# Patient Record
Sex: Male | Born: 1990 | Race: White | Hispanic: No | Marital: Single | Smoking: Never smoker
Health system: Southern US, Community
[De-identification: ages and names within clinical notes are randomized; demographics above are authoritative.]

## PROBLEM LIST (undated history)

## (undated) DIAGNOSIS — Z8669 Personal history of other diseases of the nervous system and sense organs: Secondary | ICD-10-CM

## (undated) HISTORY — PX: TYMPANOSTOMY TUBE PLACEMENT: SHX32

---
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
## 2011-01-25 NOTE — Procedures (Signed)
NAME:  Curtis Holland, Curtis Holland                ACCOUNT NO.:  [id number]   MEDICAL RECORD NO.:  [id number]          PATIENT TYPE:  OUT   LOCATION:  SLEEP CENTER                 FACILITY:  [facility]   PHYSICIAN:  Clinton D. Maple Hudson, MD, FCCP, FACPDATE OF BIRTH:  09-20-91   DATE OF STUDY:  03/07/2008                            NOCTURNAL POLYSOMNOGRAM   REFERRING PHYSICIAN:  Turkey R. Rankins, M.D.   INDICATION FOR STUDY:  Hypersomnia with sleep apnea.   EPWORTH SLEEPINESS SCORE:  1/24.  BMI 37.9.   Weight 235 pounds.  Height 66 inches.  Neck 16.5 inches.  Age 20 years.   MEDICATIONS:  Home medication charted and reviewed, none indicated.   SLEEP ARCHITECTURE:  Total sleep time 238.5 minutes with sleep  efficiency 57.5%.  Stage I was 5.2%, stage II 59.7%, stage III 23.1%,  REM 11.9% of total sleep time.  Sleep latency 22 minutes, REM latency 61  minutes.  Awake after sleep onset 119 minutes.  Arousal index 3.8.  No  bedtime medication was taken.  He was asleep for over an hour starting  at about 2345.   RESPIRATORY DATA:  Apnea-hypopnea index (AHI) 0.5 per hour, which is  normal.  A total of 2 events were scored, both hypopneas.   OXYGEN DATA:  Mild to moderate snoring with oxygen desaturation to a  nadir of 80%.  Mean oxygen saturation through the study was 96.9% on  room air.   CARDIAC DATA:  Sinus rhythm with mild sinus arrhythmia.   MOVEMENT-PARASOMNIA:  No significant movement disturbance.  No bathroom  trips.   IMPRESSIONS-RECOMMENDATIONS:  1. Sleep architecture is significant for sustained nonspecific      intervals of wakefulness during the night.  Mother had reported      home activity suggestive of sleepwalking and sleep-talking but none      was observed on this study night.  2. Occasional respiratory events affecting sleep, apnea-hypopnea index      0.5 per hour (normal range is 0-5 per hour).  Mild to moderate      snoring with oxygen desaturation to a transient nadir of 80%.   A      total of 28 minutes was recorded with oxygen saturation less than      88%.  Consider verifying this with overnight home oximetry for      possible treatment with supplemental oxygen and evaluation for      underlying cardiopulmonary disease if verified.      Clinton D. Maple Hudson, MD, [facility], FACP  Diplomate, Biomedical engineer of Sleep Medicine  Electronically Signed     CDY/MEDQ  D:  03/15/2008 12:27:57  T:  03/15/2008 12:43:31  Job:  [id number]

## 2012-08-16 ENCOUNTER — Emergency Department (HOSPITAL_COMMUNITY)
Admission: EM | Admit: 2012-08-16 | Discharge: 2012-08-16 | Disposition: A | Discharge status: Home / Self Care | Payer: Self-pay | Attending: Emergency Medicine | Admitting: Emergency Medicine

## 2012-08-16 ENCOUNTER — Encounter (HOSPITAL_COMMUNITY): Payer: Self-pay | Admitting: *Deleted

## 2012-08-16 ENCOUNTER — Emergency Department (HOSPITAL_COMMUNITY): Payer: Self-pay

## 2012-08-16 DIAGNOSIS — Y9389 Activity, other specified: Secondary | ICD-10-CM | POA: Insufficient documentation

## 2012-08-16 DIAGNOSIS — F17200 Nicotine dependence, unspecified, uncomplicated: Secondary | ICD-10-CM | POA: Insufficient documentation

## 2012-08-16 DIAGNOSIS — Y929 Unspecified place or not applicable: Secondary | ICD-10-CM | POA: Insufficient documentation

## 2012-08-16 DIAGNOSIS — T185XXA Foreign body in anus and rectum, initial encounter: Secondary | ICD-10-CM | POA: Insufficient documentation

## 2012-08-16 DIAGNOSIS — IMO0002 Reserved for concepts with insufficient information to code with codable children: Secondary | ICD-10-CM | POA: Insufficient documentation

## 2012-08-16 NOTE — ED Notes (Signed)
Pt states dildo is stuck in his rectum, pt states around 02:00 this morning. Pt denies bleeding.

## 2012-08-16 NOTE — ED Notes (Signed)
Pt reports that he has a dildo stuck in rectum since 0200. Denies pain or blood.

## 2012-08-16 NOTE — Consult Note (Addendum)
  I was asked by Dr. Eber Hong in the [facility] emergency department to consult on this patient.  Over-weight 21 year old with rectal foreign body inserted transanally which could not be extracted by the EDP.  The foreign body has been in place since 2:00 AM.  X-ray confirms FB in rectum, translucent.  On Examination the FB can be palpated approximately 8-10 cm from the anal verge by the examiners fingers.  Will attempt to remove the FB in the ED transanally.  If this cannot be done will need to go to the OR for possible laparotomy/laparoscopy or transanal removal under anesthesia.  Marta Lamas. Gae Bon, MD, FACS [phone] [facility]

## 2012-08-16 NOTE — Procedures (Signed)
9 inch long plastic, off-white foreign body removed transanally using digitally assisted angled-forceps techniques.. There was minimal pain with the procedure.  There was some bloody mucoid discharge after removal, but no increased abdominal pain or rectal pain.  The foreign body was removed completely intact.  Marta Lamas. Gae Bon, MD, FACS [phone] [facility] Surgery

## 2012-08-16 NOTE — ED Provider Notes (Addendum)
History     CSN: [id number]  Arrival date & time 08/16/12  0320   First MD Initiated Contact with Patient 08/16/12 [phone]      Chief Complaint  Patient presents with  . Foreign Body in Rectum    (Consider location/radiation/quality/duration/timing/severity/associated sxs/prior treatment) HPI Comments: Pt states that he was using a "dildo" earlier in the evening and states that he accidentally inserted the entire FB into his rectum.  It occurred at 2 PM, he tried to get it out with his own fingers but couldn't.  He has no abd or rectal pain - no f/c/n/v and no other c/o.  Sx are persistent, nothing makes better or worse.  Has no c/o sob / cp.  Patient is a 21 y.o. male presenting with foreign body in rectum. The history is provided by the patient and a parent.  Foreign Body in Rectum    History reviewed. No pertinent past medical history.  History reviewed. No pertinent past surgical history.  History reviewed. No pertinent family history.  History  Substance Use Topics  . Smoking status: Current Every Day Smoker  . Smokeless tobacco: Not on file  . Alcohol Use: No      Review of Systems  All other systems reviewed and are negative.    Allergies  Review of patient's allergies indicates no known allergies.  Home Medications   Current Outpatient Rx  Name  Route  Sig  Dispense  Refill  . ACETAMINOPHEN 325 MG PO TABS   Oral   Take 650 mg by mouth every 6 (six) hours as needed. headache           BP 142/95  Pulse 110  Temp 98.8 F (37.1 C) (Oral)  Resp 16  SpO2 99%  Physical Exam  Nursing note and vitals reviewed. Constitutional: He appears well-developed and well-nourished. No distress.  HENT:  Head: Normocephalic and atraumatic.  Mouth/Throat: Oropharynx is clear and moist. No oropharyngeal exudate.  Eyes: Conjunctivae normal and EOM are normal. Pupils are equal, round, and reactive to light. Right eye exhibits no discharge. Left eye exhibits no  discharge. No scleral icterus.  Neck: Normal range of motion. Neck supple. No JVD present. No thyromegaly present.  Cardiovascular: Normal rate, regular rhythm, normal heart sounds and intact distal pulses.  Exam reveals no gallop and no friction rub.   No murmur heard. Pulmonary/Chest: Effort normal and breath sounds normal. No respiratory distress. He has no wheezes. He has no rales.  Abdominal: Soft. Bowel sounds are normal. He exhibits no distension and no mass. There is no tenderness.  Genitourinary:       Normal appearing rectum on digital exam there are no hemorrhoids, fissures or obvoius palpable FB's.  No bleeding.  Musculoskeletal: Normal range of motion. He exhibits no edema and no tenderness.  Lymphadenopathy:    He has no cervical adenopathy.  Neurological: He is alert. Coordination normal.  Skin: Skin is warm and dry. No rash noted. No erythema.  Psychiatric: He has a normal mood and affect. His behavior is normal.    ED Course  Procedures (including critical care time)  Labs Reviewed - No data to display Dg Abd 1 View  08/16/2012  *RADIOLOGY REPORT*  Clinical Data: Foreign body in the rectum.  ABDOMEN - 1 VIEW  Comparison: None.  Findings: There is a 20 cm linear foreign body identified overlying the distal sigmoid colon and proximal rectum.  No definite free intra-abdominal air is identified, though evaluation for free air  is limited on a single supine view.  The visualized bowel gas pattern is grossly unremarkable.  No acute osseous abnormalities are seen.  IMPRESSION: 20 cm linear foreign body overlying the distal sigmoid colon and proximal rectum.  No free intra-abdominal air seen.   Original Report Authenticated By: Tonia Ghent, M.D.      1. Foreign body of rectum       MDM  abd is soft and non tender, i have ordered a xray showing FB.  I have also attempted to see if FB is seen on anoscopy - I have not been able to reach it - I have d/w Dr. Lindie Spruce with Gen Surg  who will see pt.  Procedure Note:  Anoscopy  Risks benefits alternatives of the procedure given to the patient Verbal Consent obtained Patient placed in the lateral decubitus position Anoscopy performed Findings  No FB seen, no hemorrhoids, loose brown stool present. Patient tolerated procedure without any complaints   Foreign body has been removed by general surgeon , pt has been reexamined and has no ttp in the abd, safe for d/c at this time.      Vida Roller, MD 08/16/12 [id number]  Vida Roller, MD 08/16/12 [phone]

## 2013-10-03 ENCOUNTER — Emergency Department (HOSPITAL_COMMUNITY)
Admission: EM | Admit: 2013-10-03 | Discharge: 2013-10-03 | Disposition: A | Discharge status: Home / Self Care | Payer: Self-pay | Attending: Emergency Medicine | Admitting: Emergency Medicine

## 2013-10-03 ENCOUNTER — Emergency Department (HOSPITAL_COMMUNITY): Payer: Self-pay

## 2013-10-03 ENCOUNTER — Encounter (HOSPITAL_COMMUNITY): Payer: Self-pay | Admitting: Emergency Medicine

## 2013-10-03 DIAGNOSIS — S62319A Displaced fracture of base of unspecified metacarpal bone, initial encounter for closed fracture: Secondary | ICD-10-CM | POA: Insufficient documentation

## 2013-10-03 DIAGNOSIS — Y929 Unspecified place or not applicable: Secondary | ICD-10-CM | POA: Insufficient documentation

## 2013-10-03 DIAGNOSIS — S62344A Nondisplaced fracture of base of fourth metacarpal bone, right hand, initial encounter for closed fracture: Secondary | ICD-10-CM

## 2013-10-03 DIAGNOSIS — F17200 Nicotine dependence, unspecified, uncomplicated: Secondary | ICD-10-CM | POA: Insufficient documentation

## 2013-10-03 DIAGNOSIS — Y9389 Activity, other specified: Secondary | ICD-10-CM | POA: Insufficient documentation

## 2013-10-03 DIAGNOSIS — W2209XA Striking against other stationary object, initial encounter: Secondary | ICD-10-CM | POA: Insufficient documentation

## 2013-10-03 MED ORDER — HYDROCODONE-ACETAMINOPHEN 5-325 MG PO TABS: 1.0000 | ORAL_TABLET | ORAL | Status: DC | PRN

## 2013-10-03 NOTE — Discharge Instructions (Signed)
Take Vicodin for severe pain. No driving or operating heavy machinery will take this drug as it may cause drowsiness. Followup with orthopedics. Keep your hand elevated.  Cast or Splint Care Casts and splints support injured limbs and keep bones from moving while they heal. It is important to care for your cast or splint at home.  HOME CARE INSTRUCTIONS  Keep the cast or splint uncovered during the drying period. It can take 24 to 48 hours to dry if it is made of plaster. A fiberglass cast will dry in less than 1 hour.  Do not rest the cast on anything harder than a pillow for the first 24 hours.  Do not put weight on your injured limb or apply pressure to the cast until your health care provider gives you permission.  Keep the cast or splint dry. Wet casts or splints can lose their shape and may not support the limb as well. A wet cast that has lost its shape can also create harmful pressure on your skin when it dries. Also, wet skin can become infected.  Cover the cast or splint with a plastic bag when bathing or when out in the rain or snow. If the cast is on the trunk of the body, take sponge baths until the cast is removed.  If your cast does become wet, dry it with a towel or a blow dryer on the cool setting only.  Keep your cast or splint clean. Soiled casts may be wiped with a moistened cloth.  Do not place any hard or soft foreign objects under your cast or splint, such as cotton, toilet paper, lotion, or powder.  Do not try to scratch the skin under the cast with any object. The object could get stuck inside the cast. Also, scratching could lead to an infection. If itching is a problem, use a blow dryer on a cool setting to relieve discomfort.  Do not trim or cut your cast or remove padding from inside of it.  Exercise all joints next to the injury that are not immobilized by the cast or splint. For example, if you have a long leg cast, exercise the hip joint and toes. If you  have an arm cast or splint, exercise the shoulder, elbow, thumb, and fingers.  Elevate your injured arm or leg on 1 or 2 pillows for the first 1 to 3 days to decrease swelling and pain.It is best if you can comfortably elevate your cast so it is higher than your heart. SEEK MEDICAL CARE IF:   Your cast or splint cracks.  Your cast or splint is too tight or too loose.  You have unbearable itching inside the cast.  Your cast becomes wet or develops a soft spot or area.  You have a bad smell coming from inside your cast.  You get an object stuck under your cast.  Your skin around the cast becomes red or raw.  You have new pain or worsening pain after the cast has been applied. SEEK IMMEDIATE MEDICAL CARE IF:   You have fluid leaking through the cast.  You are unable to move your fingers or toes.  You have discolored (blue or white), cool, painful, or very swollen fingers or toes beyond the cast.  You have tingling or numbness around the injured area.  You have severe pain or pressure under the cast.  You have any difficulty with your breathing or have shortness of breath.  You have chest pain. Document Released:  08/26/2000 Document Revised: 06/19/2013 Document Reviewed: 03/07/2013 [facility] Patient Information 2014 [place], [place].  Metacarpal Fractures Fractures of metacarpals are breaks in the bones of the hand. They extend from the knuckles to the wrist. These bones can break in many ways. There are different ways of treating these fractures. HOME CARE  Only exercise as told by your doctor.  Return to activities as told by your doctor.  Go to physical therapy as told by your doctor.  Follow your doctor's advice about driving.  Keep the injured hand raised (elevated) above the level of your heart.  If a plaster, fiberglass, or pre-formed splint was applied:  Wear your splint as told and until you are examined again.  Apply ice on the injury for 15-20 minutes at  a time, 03-04 times a day. Put the ice in a plastic bag. Place a towel between your skin and the bag.  Do not get your splint or cast wet. Protect it during bathing with a plastic bag.  Loosen the elastic bandage around the splint if your fingers start to get numb, tingle, get cold, or turn blue.  If the splint is plaster, do not lean it on hard surfaces or put pressure on it for 24 hours after it is put on.  Do not  try to scratch the skin under the cast.  Check the skin around the cast every day. You may put lotion on red or sore areas.  Move the fingers of your casted hand several times a day.  Only take medicine as told by your doctor.  Follow up as told by your doctor. This is very important in order to avoid permanent injury, disability, or lasting (chronic) pain. GET HELP RIGHT AWAY IF:   You develop a rash.  You have problems breathing.  You have any allergy problems.  You have more than a small spot of blood from beneath your cast or splint.  You have redness, puffiness (swelling), or more pain from beneath your cast or splint.  Yellowish white fluid (pus) comes from beneath your cast or splint.  You develop a temperature by mouth above 102 F (38.9 C), not controlled by medicne.  You have a bad smell coming from under your cast or splint.  You have problems moving any of your fingers. If you do not have a window in your cast for looking at the wound, a fluid or a little bleeding may show up as a stain on the outside of your cast. Tell your doctor about any stains you see. MAKE SURE YOU:   Understand these instructions.  Will watch your condition.  Will get help right away if you are not doing well or get worse. Document Released: 02/15/2008 Document Revised: 11/21/2011 Document Reviewed: 08/04/2009 [facility]ExitCare Patient Information 2014 [place], [place].

## 2013-10-03 NOTE — ED Provider Notes (Signed)
CSN: [id number]     Arrival date & time 10/03/13  1850 History  This chart was scribed for non-physician practitioner working with Joya Gaskins, MD by Ronal Fear, ED scribe. This patient was seen in room TR04C/TR04C and the patient's care was started at 8:11 PM.    Chief Complaint  Patient presents with  . Hand Injury   (Consider location/radiation/quality/duration/timing/severity/associated sxs/prior Treatment) The history is provided by the patient. No language interpreter was used.   HPI Comments: Curtis Holland is a 23 y.o. male who presents to the Emergency Department complaining of a right hand injury with dull aching pain after punching a wall 2x hours ago.  Pt was angry after an argument with his father.  History reviewed. No pertinent past medical history. History reviewed. No pertinent past surgical history. History reviewed. No pertinent family history. History  Substance Use Topics  . Smoking status: Current Every Day Smoker  . Smokeless tobacco: Not on file  . Alcohol Use: No    Review of Systems  Musculoskeletal: Positive for arthralgias and joint swelling.  All other systems reviewed and are negative.    Allergies  Review of patient's allergies indicates no known allergies.  Home Medications   Current Outpatient Rx  Name  Route  Sig  Dispense  Refill  . acetaminophen (TYLENOL) 325 MG tablet   Oral   Take 650 mg by mouth every 6 (six) hours as needed. headache          BP 178/103  Pulse 78  Temp(Src) 98.8 F (37.1 C) (Oral)  Resp 18  SpO2 100% Physical Exam  Nursing note and vitals reviewed. Constitutional: He is oriented to person, place, and time. He appears well-developed and well-nourished. No distress.  HENT:  Head: Normocephalic and atraumatic.  Eyes: Conjunctivae and EOM are normal.  Neck: Normal range of motion. Neck supple.  Cardiovascular: Normal rate, regular rhythm and normal heart sounds.   cap refil <3s ;intact distal pulse   Pulmonary/Chest: Effort normal and breath sounds normal.  Musculoskeletal: Normal range of motion. He exhibits no edema.  Swelling laterally to right hand; tender to palpation at mid/proximal aspect of 4th and 5th metacarpals with mild bruising; full flexion and extension of right hand; full ROM of right wrist, right wrist non-tender.  Neurological: He is alert and oriented to person, place, and time.  Skin: Skin is warm and dry.  Skin intact.  Psychiatric: He has a normal mood and affect. His behavior is normal.    ED Course  Procedures (including critical care time) Labs Review Labs Reviewed - No data to display Imaging Review Dg Hand Complete Right  10/03/2013   CLINICAL DATA:  Punched a wall.  EXAM: RIGHT HAND - COMPLETE 3+ VIEW  COMPARISON:  None available for comparison at time of study interpretation.  FINDINGS: Nondisplaced base of the fourth metacarpal fracture with mild impaction, no definite intra-articular extension. In addition, there is a non corticated fracture fragment inferior to the base of the fifth metacarpus best seen on the oblique view. No dislocation. No destructive bony lesions. Dorsal soft tissue swelling with a gas or radiopaque.  IMPRESSION: Nondisplaced base of fourth metacarpal fracture without dislocation.  Fracture fragment inferior to the base of the fifth metacarpus, without discrete donor site, a carpal injury may have this appearance. If clinically indicated, CT of the wrist could be performed.   Electronically Signed   By: Awilda Metro   On: 10/03/2013 20:07   Full ROM wrist,  wrist non-tender, no swelling.  EKG Interpretation   None       MDM   1. Closed nondisplaced fracture of base of fourth metacarpal bone of right hand    Neurovascularly intact. F/u with ortho. Return precautions given. Patient states understanding of treatment care plan and is agreeable.   I personally performed the services described in this documentation, which was  scribed in my presence. The recorded information has been reviewed and is accurate.     Trevor MaceRobyn M Albert, PA-C 10/03/13 2029

## 2013-10-03 NOTE — ED Notes (Signed)
Pt to xray

## 2013-10-03 NOTE — ED Notes (Signed)
Pt reports punching a wall today and having right hand pain.

## 2013-10-03 NOTE — ED Notes (Signed)
Returned from xray

## 2013-10-03 NOTE — Progress Notes (Signed)
Orthopedic Tech Progress Note Patient Details:  Benny LennertJesse J Duncombe [birth year] [id number]  Ortho Devices Type of Ortho Device: Ace wrap;Volar splint Ortho Device/Splint Location: RUE Ortho Device/Splint Interventions: Ordered;Application   Jennye MoccasinHughes, Ebrima Ranta Craig 10/03/2013, 8:29 PM

## 2013-10-04 NOTE — ED Provider Notes (Signed)
Medical screening examination/treatment/procedure(s) were performed by non-physician practitioner and as supervising physician I was immediately available for consultation/collaboration.  EKG Interpretation   None         Zeph Riebel W [place], MD 10/04/13 1136 

## 2015-09-17 ENCOUNTER — Encounter (HOSPITAL_COMMUNITY): Payer: Self-pay | Admitting: *Deleted

## 2015-09-17 ENCOUNTER — Emergency Department (HOSPITAL_COMMUNITY)
Admission: EM | Admit: 2015-09-17 | Discharge: 2015-09-17 | Disposition: A | Discharge status: Home / Self Care | Payer: Self-pay | Attending: Emergency Medicine | Admitting: Emergency Medicine

## 2015-09-17 DIAGNOSIS — H9193 Unspecified hearing loss, bilateral: Secondary | ICD-10-CM | POA: Insufficient documentation

## 2015-09-17 DIAGNOSIS — H9212 Otorrhea, left ear: Secondary | ICD-10-CM | POA: Insufficient documentation

## 2015-09-17 DIAGNOSIS — R05 Cough: Secondary | ICD-10-CM | POA: Insufficient documentation

## 2015-09-17 DIAGNOSIS — H9203 Otalgia, bilateral: Secondary | ICD-10-CM | POA: Insufficient documentation

## 2015-09-17 HISTORY — DX: Personal history of other diseases of the nervous system and sense organs: Z86.69

## 2015-09-17 MED ORDER — AMOXICILLIN 500 MG PO CAPS: 500.0000 mg | ORAL_CAPSULE | Freq: Three times a day (TID) | ORAL | Status: DC

## 2015-09-17 NOTE — ED Provider Notes (Signed)
CSN: [id number]     Arrival date & time 09/17/15  1205 History  By signing my name below, I, Freida Busman, attest that this documentation has been prepared under the direction and in the presence of non-physician practitioner, Rhea Bleacher PA-C. Electronically Signed: Freida Busman, Scribe. 09/17/2015. 1:26 PM.    Chief Complaint  Patient presents with  . Otalgia   The history is provided by the patient. No language interpreter was used.     HPI Comments:  Curtis Holland is a 25 y.o. male who presents to the Emergency Department complaining of bilateral ear pain x ~ 7 months. He states he experiences the pain daily. He also notes drainage from the left ear and mild hearing loss. Pt notes associated cough and congestion x a few weeks. He denies recent fever, and trauma to the ear. Pt admits to using Q-tips to clean his ears. No alleviating factors noted.   Past Medical History  Diagnosis Date  . History of ear infections    Past Surgical History  Procedure Laterality Date  . Tympanostomy tube placement     No family history on file. Social History  Substance Use Topics  . Smoking status: Never Smoker   . Smokeless tobacco: None  . Alcohol Use: No    Review of Systems  Constitutional: Negative for fever, chills and fatigue.  HENT: Positive for congestion, ear pain and hearing loss. Negative for rhinorrhea, sinus pressure and sore throat.   Eyes: Negative for redness.  Respiratory: Positive for cough. Negative for wheezing.   Gastrointestinal: Negative for nausea, vomiting, abdominal pain and diarrhea.  Genitourinary: Negative for dysuria.  Musculoskeletal: Negative for myalgias and neck stiffness.  Skin: Negative for rash.  Neurological: Negative for headaches.  Hematological: Negative for adenopathy.   Allergies  Review of patient's allergies indicates no known allergies.  Home Medications   Prior to Admission medications   Medication Sig Start Date End Date Taking?  Authorizing Provider  acetaminophen (TYLENOL) 325 MG tablet Take 650 mg by mouth every 6 (six) hours as needed. headache    Historical Provider, MD  HYDROcodone-acetaminophen (NORCO/VICODIN) 5-325 MG per tablet Take 1-2 tablets by mouth every 4 (four) hours as needed. 10/03/13   Robyn M Hess, PA-C   BP 146/113 mmHg  Pulse 107  Temp(Src) 98.4 F (36.9 C) (Oral)  Resp 18  Ht 5\' 8"  (1.727 m)  Wt 277 lb (125.646 kg)  BMI 42.13 kg/m2  SpO2 100%   Physical Exam  Constitutional: He appears well-developed and well-nourished. No distress.  HENT:  Head: Normocephalic and atraumatic.  Right Ear: External ear and ear canal normal. Tympanic membrane is erythematous. Tympanic membrane is not retracted and not bulging. No middle ear effusion. Decreased hearing is noted.  Left Ear: Ear canal normal. There is drainage (Mild, clear). Tympanic membrane is erythematous and bulging. Tympanic membrane is not retracted. A middle ear effusion is present. Decreased hearing is noted.  Nose: Nose normal. No mucosal edema or rhinorrhea.  Mouth/Throat: Uvula is midline, oropharynx is clear and moist and mucous membranes are normal. Mucous membranes are not dry. No trismus in the jaw. No uvula swelling. No oropharyngeal exudate, posterior oropharyngeal edema, posterior oropharyngeal erythema or tonsillar abscesses.  Eyes: Conjunctivae are normal. Right eye exhibits no discharge. Left eye exhibits no discharge.  Neck: Normal range of motion. Neck supple.  Cardiovascular: Normal rate, regular rhythm and normal heart sounds.   Pulmonary/Chest: Effort normal and breath sounds normal. No respiratory distress. He has  no wheezes. He has no rales.  Abdominal: Soft. He exhibits no distension. There is no tenderness.  Neurological: He is alert.  Skin: Skin is warm and dry.  Psychiatric: He has a normal mood and affect.  Nursing note and vitals reviewed.   ED Course  Procedures   DIAGNOSTIC STUDIES:  Oxygen Saturation  is 100% on RA, normal by my interpretation.    COORDINATION OF CARE:  1:23 PM Will discharge with amoxicillin and referral to ENT. Discussed treatment plan with pt at bedside and pt agreed to plan.  Vital signs reviewed and are as follows: Filed Vitals:   09/17/15 1258  BP: 146/113  Pulse: 107  Temp: 98.4 F (36.9 C)  Resp: 18      MDM   Final diagnoses:  Ear pain, bilateral  Otorrhea of left ear   Patient with reported hearing loss, drainage, ear pain for several months. Unclear etiology. Will treat for infection with amoxicillin. Feel that the patient warrants ENT follow-up given prolonged symptoms and hearing loss. No signs of malignant otitis externa, history of immune compromise. No current fevers.  I personally performed the services described in this documentation, which was scribed in my presence. The recorded information has been reviewed and is accurate.    Renne CriglerJoshua Jarae Nemmers, PA-C 09/17/15 1346  Pricilla LovelessScott Goldston, MD 09/18/15 [phone]

## 2015-09-17 NOTE — Discharge Instructions (Signed)
Please read and follow all provided instructions.  Your diagnoses today include:  1. Ear pain, bilateral   2. Otorrhea of left ear    Tests performed today include:  Vital signs. See below for your results today.   Medications prescribed:   Amoxicillin - antibiotic  You have been prescribed an antibiotic medicine: take the entire course of medicine even if you are feeling better. Stopping early can cause the antibiotic not to work.  Take any prescribed medications only as directed.  Home care instructions:  Follow any educational materials contained in this packet.  BE VERY CAREFUL not to take multiple medicines containing Tylenol (also called acetaminophen). Doing so can lead to an overdose which can damage your liver and cause liver failure and possibly death.   Follow-up instructions: Please follow-up with your primary care provider in the next 3 days for further evaluation of your symptoms.   Return instructions:   Please return to the Emergency Department if you experience worsening symptoms.   Please return if you have any other emergent concerns.  Additional Information:  Your vital signs today were: BP 146/113 mmHg   Pulse 107   Temp(Src) 98.4 F (36.9 C) (Oral)   Resp 18   Ht 5\' 8"  (1.727 m)   Wt 125.646 kg   BMI 42.13 kg/m2   SpO2 100% If your blood pressure (BP) was elevated above 135/85 this visit, please have this repeated by your doctor within one month. --------------

## 2015-09-17 NOTE — ED Notes (Signed)
Pt with bil ear pain for several weeks.  States L ear drum burst.  Came today b/c he was finally able to get a ride.

## 2018-05-09 ENCOUNTER — Encounter (HOSPITAL_COMMUNITY): Payer: Self-pay

## 2018-05-09 ENCOUNTER — Emergency Department (HOSPITAL_COMMUNITY)
Admission: EM | Admit: 2018-05-09 | Discharge: 2018-05-09 | Disposition: A | Discharge status: Home / Self Care | Payer: Self-pay | Attending: Emergency Medicine | Admitting: Emergency Medicine

## 2018-05-09 DIAGNOSIS — Z203 Contact with and (suspected) exposure to rabies: Secondary | ICD-10-CM | POA: Insufficient documentation

## 2018-05-09 DIAGNOSIS — F329 Major depressive disorder, single episode, unspecified: Secondary | ICD-10-CM | POA: Insufficient documentation

## 2018-05-09 DIAGNOSIS — Z23 Encounter for immunization: Secondary | ICD-10-CM | POA: Insufficient documentation

## 2018-05-09 DIAGNOSIS — Z2914 Encounter for prophylactic rabies immune globin: Secondary | ICD-10-CM | POA: Insufficient documentation

## 2018-05-09 DIAGNOSIS — Z209 Contact with and (suspected) exposure to unspecified communicable disease: Secondary | ICD-10-CM

## 2018-05-09 MED ORDER — RABIES IMMUNE GLOBULIN 150 UNIT/ML IM INJ
20.0000 [IU]/kg | INJECTION | Freq: Once | INTRAMUSCULAR | Status: AC
  Administered 2018-05-09: 2475 [IU] via INTRAMUSCULAR
  Filled 2018-05-09: qty 16.5

## 2018-05-09 MED ORDER — TETANUS-DIPHTH-ACELL PERTUSSIS 5-2.5-18.5 LF-MCG/0.5 IM SUSP
0.5000 mL | Freq: Once | INTRAMUSCULAR | Status: AC
  Administered 2018-05-09: 0.5 mL via INTRAMUSCULAR
  Filled 2018-05-09: qty 0.5

## 2018-05-09 MED ORDER — RABIES VACCINE, PCEC IM SUSR
1.0000 mL | Freq: Once | INTRAMUSCULAR | Status: AC
  Administered 2018-05-09: 1 mL via INTRAMUSCULAR
  Filled 2018-05-09: qty 1

## 2018-05-09 NOTE — ED Triage Notes (Signed)
Pt reports being scratched by a bat today on his right chest. No break in the skin or redness noted.

## 2018-05-09 NOTE — Discharge Instructions (Signed)
You have had a potential rabies exposure from a bat.  Please follow the instructions on the letter provided with your paperwork today as you will need to follow-up with the [place] [facility] urgent care for 3 additional vaccinations.  It is extremely important that you complete this process to protect herself from rabies.  Please read the information about rabies vaccine provided, return if you have any redness swelling, difficulty breathing, facial swelling, lightheadedness or feel as though you are going to pass out or any other symptoms that may suggest an allergic reaction to vaccine.

## 2018-05-09 NOTE — ED Provider Notes (Signed)
[facility]   CSN: [id number] Arrival date & time: 05/09/18  1623     History   Chief Complaint Chief Complaint  Patient presents with  . Animal Bite    HPI Curtis Holland is a 27 y.o. male.  Curtis Holland is a 27 y.o. Male who is otherwise healthy, presents to the emergency department for evaluation after a bad exposure.  Patient reports this morning he was outside taking close down from a line when a bat flew in and latched onto the right side of his upper chest.  He reports he is unsure if he was scratched or bitten.  Has never had rabies vaccines or immunoglobulin before, no prior exposures.  Has not noted any break in the skin or scratch.  Vision is not having any confusion or mental changes, no myalgias, fevers, muscle spasms, hallucinations or other symptoms of rabies.  The history is provided by the patient.  Animal Bite  Contact animal:  Bat Pain details:    Severity:  No pain   Timing:  Unable to specify   Progression:  Unchanged Incident location:  Outside and home Provoked: unprovoked   Notifications:  None Animal's rabies vaccination status:  Never received Animal in possession: no   Tetanus status:  Out of date Relieved by:  None tried Worsened by:  Nothing Associated symptoms: no fever, no numbness, no rash and no swelling     Past Medical History:  Diagnosis Date  . History of ear infections     Patient Active Problem List   Diagnosis Date Noted  . DEPRESSIVE DISORDER NOT ELSEWHERE CLASSIFIED 01/29/2008  . HYPERSOMNIA, ASSOCIATED WITH SLEEP APNEA 01/29/2008    Past Surgical History:  Procedure Laterality Date  . TYMPANOSTOMY TUBE PLACEMENT          Home Medications    Prior to Admission medications   Medication Sig Start Date End Date Taking? Authorizing Provider  acetaminophen (TYLENOL) 325 MG tablet Take 650 mg by mouth every 6 (six) hours as needed. headache    [provider]  amoxicillin (AMOXIL) 500 MG capsule Take 1 capsule (500 mg total) by mouth 3 (three) times daily. 09/17/15   Renne CriglerGeiple, Joshua, PA-C  HYDROcodone-acetaminophen (NORCO/VICODIN) 5-325 MG per tablet Take 1-2 tablets by mouth every 4 (four) hours as needed. 10/03/13   Hess, Nada Boozerobyn M, PA-C    Family History No family history on file.  Social History Social History   Tobacco Use  . Smoking status: Never Smoker  Substance Use Topics  . Alcohol use: No  . Drug use: Yes    Types: Marijuana     Allergies   Patient has no known allergies.   Review of Systems Review of Systems  Constitutional: Negative for chills and fever.  HENT: Negative.   Eyes: Negative for visual disturbance.  Musculoskeletal: Negative for arthralgias and myalgias.  Skin: Negative for rash.  Neurological: Negative for dizziness, weakness and numbness.  Psychiatric/Behavioral: Negative for confusion and hallucinations.     Physical Exam Updated Vital Signs BP (!) 152/90   Pulse 67   Temp 98.5 F (36.9 C) (Oral)   Resp 16   SpO2 100%   Physical Exam  Constitutional: He is oriented to person, place, and time. He appears well-developed and well-nourished. No distress.  HENT:  Head: Normocephalic and atraumatic.  Eyes: Right eye exhibits no discharge. Left eye exhibits no discharge.  Neck: Neck supple.  Cardiovascular: Normal rate, regular  rhythm, normal heart sounds and intact distal pulses.  Pulmonary/Chest: Effort normal and breath sounds normal. No respiratory distress.  Respirations equal and unlabored, patient able to speak in full sentences, lungs clear to auscultation bilaterally Erythema or break in the skin over the right chest wall where patient reports bad exposure  Musculoskeletal:  Moving all extremities without difficulty  Neurological: He is alert and oriented to person, place, and time. Coordination normal.  Skin: Skin is warm and dry. Capillary refill takes less than 2 seconds. No rash  noted. He is not diaphoretic.  Psychiatric: He has a normal mood and affect. His behavior is normal.  Nursing note and vitals reviewed.    ED Treatments / Results  Labs (all labs ordered are listed, but only abnormal results are displayed) Labs Reviewed - No data to display  EKG None  Radiology No results found.  Procedures Procedures (including critical care time)  Medications Ordered in ED Medications  rabies vaccine (RABAVERT) injection 1 mL (has no administration in time range)  rabies immune globulin (HYPERAB/KEDRAB) injection 2,475 Units (has no administration in time range)  Tdap (BOOSTRIX) injection 0.5 mL (has no administration in time range)     Initial Impression / Assessment and Plan / ED Course  I have reviewed the triage vital signs and the nursing notes.  Pertinent labs & imaging results that were available during my care of the patient were reviewed by me and considered in my medical decision making (see chart for details).  Patient presents for evaluation after potential bat exposure.  Was outside his house today when a bat flew in and attached to his right chest.  On exam there is no break in the skin or redness.  Patient is not exhibiting any other symptoms, stable vitals here in the ED.  Received rabies vaccination or immunoglobulin before.  Tetanus not updated recently.  Given concerning story with exposure will provide vaccination and rabies immunoglobulin and update tetanus.  Patient provided with rabies letter with information for follow-up vaccinations on day 3, 7 and 14.  Patient expresses understanding and is in agreement with this plan he will follow-up at [facility] urgent care for further vaccination.  Return precautions discussed and patient expresses understanding and is in agreement with plan.  Final Clinical Impressions(s) / ED Diagnoses   Final diagnoses:  Exposure to bat without known bite  Need for rabies vaccination    ED Discharge  Orders    None       Legrand Rams 05/09/18 1714    Charlynne Pander, MD 05/12/18 2121

## 2018-05-12 ENCOUNTER — Ambulatory Visit (HOSPITAL_COMMUNITY)
Admission: EM | Admit: 2018-05-12 | Discharge: 2018-05-12 | Disposition: A | Discharge status: Home / Self Care | Payer: Self-pay | Attending: Family Medicine | Admitting: Family Medicine

## 2018-05-12 DIAGNOSIS — Z23 Encounter for immunization: Secondary | ICD-10-CM

## 2018-05-12 DIAGNOSIS — Z203 Contact with and (suspected) exposure to rabies: Secondary | ICD-10-CM

## 2018-05-12 MED ORDER — RABIES VACCINE, PCEC IM SUSR
1.0000 mL | Freq: Once | INTRAMUSCULAR | Status: AC
  Administered 2018-05-12: 1 mL via INTRAMUSCULAR

## 2018-05-12 MED ORDER — RABIES VACCINE, PCEC IM SUSR
INTRAMUSCULAR | Status: AC
  Filled 2018-05-12: qty 1

## 2018-05-12 NOTE — ED Notes (Signed)
Pt here for day 3 rabies shot.  

## 2018-05-19 ENCOUNTER — Ambulatory Visit (HOSPITAL_COMMUNITY)
Admission: EM | Admit: 2018-05-19 | Discharge: 2018-05-19 | Disposition: A | Discharge status: Home / Self Care | Payer: Self-pay | Attending: Internal Medicine | Admitting: Internal Medicine

## 2018-05-19 DIAGNOSIS — Z203 Contact with and (suspected) exposure to rabies: Secondary | ICD-10-CM

## 2018-05-19 DIAGNOSIS — Z23 Encounter for immunization: Secondary | ICD-10-CM

## 2018-05-19 MED ORDER — RABIES VACCINE, PCEC IM SUSR
1.0000 mL | Freq: Once | INTRAMUSCULAR | Status: AC
  Administered 2018-05-19: 1 mL via INTRAMUSCULAR

## 2018-05-19 MED ORDER — RABIES VACCINE, PCEC IM SUSR
INTRAMUSCULAR | Status: AC
  Filled 2018-05-19: qty 1

## 2018-05-19 NOTE — ED Notes (Signed)
Pt presents to have day 7 rabies shots Pt tolerated shot well Given in left deltoid

## 2019-04-03 ENCOUNTER — Emergency Department (HOSPITAL_COMMUNITY): Payer: 59 | Admitting: Anesthesiology

## 2019-04-03 ENCOUNTER — Encounter (HOSPITAL_COMMUNITY): Payer: Self-pay | Admitting: *Deleted

## 2019-04-03 ENCOUNTER — Observation Stay (HOSPITAL_COMMUNITY)
Admission: EM | Admit: 2019-04-03 | Discharge: 2019-04-04 | Disposition: A | Discharge status: Home / Self Care | Payer: 59 | Attending: Surgery | Admitting: Surgery

## 2019-04-03 ENCOUNTER — Emergency Department (HOSPITAL_COMMUNITY): Payer: 59

## 2019-04-03 ENCOUNTER — Encounter (HOSPITAL_COMMUNITY)
Admission: EM | Disposition: A | Discharge status: Home / Self Care | Payer: Self-pay | Source: Home / Self Care | Attending: Emergency Medicine

## 2019-04-03 ENCOUNTER — Other Ambulatory Visit: Payer: Self-pay

## 2019-04-03 DIAGNOSIS — Z1159 Encounter for screening for other viral diseases: Secondary | ICD-10-CM | POA: Diagnosis not present

## 2019-04-03 DIAGNOSIS — Z9049 Acquired absence of other specified parts of digestive tract: Secondary | ICD-10-CM

## 2019-04-03 DIAGNOSIS — K3580 Unspecified acute appendicitis: Principal | ICD-10-CM | POA: Insufficient documentation

## 2019-04-03 DIAGNOSIS — G4730 Sleep apnea, unspecified: Secondary | ICD-10-CM | POA: Insufficient documentation

## 2019-04-03 DIAGNOSIS — G4710 Hypersomnia, unspecified: Secondary | ICD-10-CM | POA: Insufficient documentation

## 2019-04-03 DIAGNOSIS — R109 Unspecified abdominal pain: Secondary | ICD-10-CM | POA: Diagnosis present

## 2019-04-03 HISTORY — PX: LAPAROSCOPIC APPENDECTOMY: SHX408

## 2019-04-03 HISTORY — PX: APPENDECTOMY: SHX54

## 2019-04-03 LAB — COMPREHENSIVE METABOLIC PANEL
ALT: 29 U/L (ref 0–44)
AST: 25 U/L (ref 15–41)
Albumin: 4.6 g/dL (ref 3.5–5.0)
Alkaline Phosphatase: 86 U/L (ref 38–126)
Anion gap: 12 (ref 5–15)
BUN: 6 mg/dL (ref 6–20)
CO2: 23 mmol/L (ref 22–32)
Calcium: 9.8 mg/dL (ref 8.9–10.3)
Chloride: 105 mmol/L (ref 98–111)
Creatinine, Ser: 1.04 mg/dL (ref 0.61–1.24)
GFR calc Af Amer: >60 mL/min (ref >60)
GFR calc non Af Amer: >60 mL/min (ref >60)
Glucose, Bld: 99 mg/dL (ref 70–99)
Potassium: 3.4 mmol/L — ABNORMAL LOW (ref 3.5–5.1)
Sodium: 140 mmol/L (ref 135–145)
Total Bilirubin: 1 mg/dL (ref 0.3–1.2)
Total Protein: 7.7 g/dL (ref 6.5–8.1)

## 2019-04-03 LAB — CBC
HCT: 48 % (ref 39.0–52.0)
Hemoglobin: 15.9 g/dL (ref 13.0–17.0)
MCH: 27.1 pg (ref 26.0–34.0)
MCHC: 33.1 g/dL (ref 30.0–36.0)
MCV: 81.9 fL (ref 80.0–100.0)
Platelets: 276 10*3/uL (ref 150–400)
RBC: 5.86 MIL/uL — ABNORMAL HIGH (ref 4.22–5.81)
RDW: 12.5 % (ref 11.5–15.5)
WBC: 12.4 10*3/uL — ABNORMAL HIGH (ref 4.0–10.5)
nRBC: 0 % (ref 0.0–0.2)

## 2019-04-03 LAB — URINALYSIS, ROUTINE W REFLEX MICROSCOPIC
Bilirubin Urine: NEGATIVE
Glucose, UA: NEGATIVE mg/dL
Hgb urine dipstick: NEGATIVE
Ketones, ur: 5 mg/dL — AB
Leukocytes,Ua: NEGATIVE
Nitrite: NEGATIVE
Protein, ur: NEGATIVE mg/dL
Specific Gravity, Urine: 1.02 (ref 1.005–1.030)
pH: 6 (ref 5.0–8.0)

## 2019-04-03 LAB — SARS CORONAVIRUS 2 BY RT PCR (HOSPITAL ORDER, PERFORMED IN ~~LOC~~ HOSPITAL LAB): SARS Coronavirus 2: NEGATIVE

## 2019-04-03 LAB — LIPASE, BLOOD: Lipase: 34 U/L (ref 11–51)

## 2019-04-03 SURGERY — APPENDECTOMY, LAPAROSCOPIC
Anesthesia: General | Site: Abdomen

## 2019-04-03 MED ORDER — HYDROMORPHONE HCL 1 MG/ML IJ SOLN: 0.5000 mg | INTRAMUSCULAR | Status: DC | PRN

## 2019-04-03 MED ORDER — PHENYLEPHRINE 40 MCG/ML (10ML) SYRINGE FOR IV PUSH (FOR BLOOD PRESSURE SUPPORT)
PREFILLED_SYRINGE | INTRAVENOUS | Status: AC
  Filled 2019-04-03: qty 10

## 2019-04-03 MED ORDER — LIDOCAINE HCL (CARDIAC) PF 100 MG/5ML IV SOSY
PREFILLED_SYRINGE | INTRAVENOUS | Status: DC | PRN
  Administered 2019-04-03: 60 mg via INTRATRACHEAL

## 2019-04-03 MED ORDER — EPHEDRINE 5 MG/ML INJ
INTRAVENOUS | Status: AC
  Filled 2019-04-03: qty 10

## 2019-04-03 MED ORDER — SODIUM CHLORIDE 0.9 % IV SOLN
2.0000 g | Freq: Once | INTRAVENOUS | Status: DC
  Filled 2019-04-03: qty 20

## 2019-04-03 MED ORDER — SODIUM CHLORIDE 0.9 % IR SOLN
Status: DC | PRN
  Administered 2019-04-03: 1000 mL

## 2019-04-03 MED ORDER — IBUPROFEN 200 MG PO TABS: 600.0000 mg | ORAL_TABLET | Freq: Four times a day (QID) | ORAL | Status: DC | PRN

## 2019-04-03 MED ORDER — DIPHENHYDRAMINE HCL 50 MG/ML IJ SOLN: 12.5000 mg | Freq: Four times a day (QID) | INTRAMUSCULAR | Status: DC | PRN

## 2019-04-03 MED ORDER — PROPOFOL 10 MG/ML IV BOLUS
INTRAVENOUS | Status: AC
  Filled 2019-04-03: qty 20

## 2019-04-03 MED ORDER — METRONIDAZOLE IN NACL 5-0.79 MG/ML-% IV SOLN
500.0000 mg | Freq: Once | INTRAVENOUS | Status: AC
  Administered 2019-04-03: 21:00:00 500 mg via INTRAVENOUS
  Filled 2019-04-03: qty 100

## 2019-04-03 MED ORDER — FENTANYL CITRATE (PF) 250 MCG/5ML IJ SOLN
INTRAMUSCULAR | Status: AC
  Filled 2019-04-03: qty 5

## 2019-04-03 MED ORDER — SODIUM CHLORIDE (PF) 0.9 % IJ SOLN
INTRAMUSCULAR | Status: AC
  Filled 2019-04-03: qty 10

## 2019-04-03 MED ORDER — IOHEXOL 300 MG/ML  SOLN
100.0000 mL | Freq: Once | INTRAMUSCULAR | Status: AC | PRN
  Administered 2019-04-03: 100 mL via INTRAVENOUS

## 2019-04-03 MED ORDER — MIDAZOLAM HCL 5 MG/5ML IJ SOLN
INTRAMUSCULAR | Status: DC | PRN
  Administered 2019-04-03: 2 mg via INTRAVENOUS

## 2019-04-03 MED ORDER — SUCCINYLCHOLINE CHLORIDE 20 MG/ML IJ SOLN
INTRAMUSCULAR | Status: DC | PRN
  Administered 2019-04-03: 120 mg via INTRAVENOUS

## 2019-04-03 MED ORDER — BUPIVACAINE-EPINEPHRINE (PF) 0.25% -1:200000 IJ SOLN
INTRAMUSCULAR | Status: AC
  Filled 2019-04-03: qty 30

## 2019-04-03 MED ORDER — ONDANSETRON HCL 4 MG/2ML IJ SOLN
INTRAMUSCULAR | Status: AC
  Filled 2019-04-03: qty 2

## 2019-04-03 MED ORDER — ARTIFICIAL TEARS OPHTHALMIC OINT
TOPICAL_OINTMENT | OPHTHALMIC | Status: AC
  Filled 2019-04-03: qty 3.5

## 2019-04-03 MED ORDER — ACETAMINOPHEN 325 MG PO TABS
650.0000 mg | ORAL_TABLET | Freq: Four times a day (QID) | ORAL | Status: DC
  Administered 2019-04-04 (×3): 650 mg via ORAL
  Filled 2019-04-03 (×3): qty 2

## 2019-04-03 MED ORDER — SODIUM CHLORIDE 0.9% FLUSH
3.0000 mL | Freq: Once | INTRAVENOUS | Status: AC
  Administered 2019-04-03: 3 mL via INTRAVENOUS

## 2019-04-03 MED ORDER — SUCCINYLCHOLINE CHLORIDE 200 MG/10ML IV SOSY
PREFILLED_SYRINGE | INTRAVENOUS | Status: AC
Start: 2019-04-03 — End: ?
  Filled 2019-04-03: qty 20

## 2019-04-03 MED ORDER — ONDANSETRON HCL 4 MG/2ML IJ SOLN: 4.0000 mg | Freq: Four times a day (QID) | INTRAMUSCULAR | Status: DC | PRN

## 2019-04-03 MED ORDER — DEXAMETHASONE SODIUM PHOSPHATE 10 MG/ML IJ SOLN
INTRAMUSCULAR | Status: DC | PRN
  Administered 2019-04-03: 10 mg via INTRAVENOUS

## 2019-04-03 MED ORDER — ROCURONIUM BROMIDE 10 MG/ML (PF) SYRINGE
PREFILLED_SYRINGE | INTRAVENOUS | Status: AC
  Filled 2019-04-03: qty 10

## 2019-04-03 MED ORDER — EPHEDRINE SULFATE 50 MG/ML IJ SOLN
INTRAMUSCULAR | Status: DC | PRN
  Administered 2019-04-03 (×2): 5 mg via INTRAVENOUS
  Administered 2019-04-03: 10 mg via INTRAVENOUS

## 2019-04-03 MED ORDER — CEFAZOLIN SODIUM 1 G IJ SOLR
INTRAMUSCULAR | Status: AC
  Filled 2019-04-03: qty 20

## 2019-04-03 MED ORDER — METRONIDAZOLE IN NACL 5-0.79 MG/ML-% IV SOLN
500.0000 mg | Freq: Once | INTRAVENOUS | Status: DC
  Filled 2019-04-03: qty 100

## 2019-04-03 MED ORDER — PROPOFOL 10 MG/ML IV BOLUS
INTRAVENOUS | Status: DC | PRN
  Administered 2019-04-03: 200 mg via INTRAVENOUS

## 2019-04-03 MED ORDER — ROCURONIUM BROMIDE 100 MG/10ML IV SOLN
INTRAVENOUS | Status: DC | PRN
  Administered 2019-04-03: 50 mg via INTRAVENOUS

## 2019-04-03 MED ORDER — FENTANYL CITRATE (PF) 250 MCG/5ML IJ SOLN
INTRAMUSCULAR | Status: DC | PRN
  Administered 2019-04-03: 100 ug via INTRAVENOUS
  Administered 2019-04-03: 50 ug via INTRAVENOUS

## 2019-04-03 MED ORDER — SUGAMMADEX SODIUM 200 MG/2ML IV SOLN
INTRAVENOUS | Status: DC | PRN
  Administered 2019-04-03: 200 mg via INTRAVENOUS

## 2019-04-03 MED ORDER — DIPHENHYDRAMINE HCL 50 MG/ML IJ SOLN
INTRAMUSCULAR | Status: AC
  Filled 2019-04-03: qty 1

## 2019-04-03 MED ORDER — TRAMADOL HCL 50 MG PO TABS: 50.0000 mg | ORAL_TABLET | Freq: Four times a day (QID) | ORAL | Status: DC | PRN

## 2019-04-03 MED ORDER — LIDOCAINE 2% (20 MG/ML) 5 ML SYRINGE
INTRAMUSCULAR | Status: AC
  Filled 2019-04-03: qty 5

## 2019-04-03 MED ORDER — SIMETHICONE 80 MG PO CHEW: 40.0000 mg | CHEWABLE_TABLET | Freq: Four times a day (QID) | ORAL | Status: DC | PRN

## 2019-04-03 MED ORDER — MIDAZOLAM HCL 2 MG/2ML IJ SOLN
INTRAMUSCULAR | Status: AC
  Filled 2019-04-03: qty 2

## 2019-04-03 MED ORDER — SODIUM CHLORIDE 0.9 % IV SOLN
2.0000 g | Freq: Once | INTRAVENOUS | Status: AC
  Administered 2019-04-03: 21:00:00 2 g via INTRAVENOUS

## 2019-04-03 MED ORDER — DIPHENHYDRAMINE HCL 12.5 MG/5ML PO ELIX: 12.5000 mg | ORAL_SOLUTION | Freq: Four times a day (QID) | ORAL | Status: DC | PRN

## 2019-04-03 MED ORDER — 0.9 % SODIUM CHLORIDE (POUR BTL) OPTIME
TOPICAL | Status: DC | PRN
  Administered 2019-04-03: 1000 mL

## 2019-04-03 MED ORDER — HYDROMORPHONE HCL 1 MG/ML IJ SOLN
0.2500 mg | INTRAMUSCULAR | Status: DC | PRN
  Administered 2019-04-03: 0.5 mg via INTRAVENOUS

## 2019-04-03 MED ORDER — DOCUSATE SODIUM 100 MG PO CAPS
100.0000 mg | ORAL_CAPSULE | Freq: Two times a day (BID) | ORAL | Status: DC
  Administered 2019-04-04 (×2): 100 mg via ORAL
  Filled 2019-04-03 (×2): qty 1

## 2019-04-03 MED ORDER — BUPIVACAINE-EPINEPHRINE 0.25% -1:200000 IJ SOLN
INTRAMUSCULAR | Status: DC | PRN
  Administered 2019-04-03: 30 mL

## 2019-04-03 MED ORDER — SUCCINYLCHOLINE CHLORIDE 200 MG/10ML IV SOSY
PREFILLED_SYRINGE | INTRAVENOUS | Status: AC
  Filled 2019-04-03: qty 10

## 2019-04-03 MED ORDER — ONDANSETRON 4 MG PO TBDP: 4.0000 mg | ORAL_TABLET | Freq: Four times a day (QID) | ORAL | Status: DC | PRN

## 2019-04-03 MED ORDER — HYDROMORPHONE HCL 1 MG/ML IJ SOLN
INTRAMUSCULAR | Status: AC
  Filled 2019-04-03: qty 1

## 2019-04-03 MED ORDER — LIDOCAINE 2% (20 MG/ML) 5 ML SYRINGE
INTRAMUSCULAR | Status: AC
  Filled 2019-04-03: qty 20

## 2019-04-03 MED ORDER — LACTATED RINGERS IV SOLN
INTRAVENOUS | Status: DC | PRN
  Administered 2019-04-03 (×2): via INTRAVENOUS

## 2019-04-03 MED ORDER — HEPARIN SODIUM (PORCINE) 5000 UNIT/ML IJ SOLN
5000.0000 [IU] | Freq: Three times a day (TID) | INTRAMUSCULAR | Status: DC
  Administered 2019-04-04 (×2): 5000 [IU] via SUBCUTANEOUS
  Filled 2019-04-03 (×2): qty 1

## 2019-04-03 MED ORDER — ONDANSETRON HCL 4 MG/2ML IJ SOLN
INTRAMUSCULAR | Status: DC | PRN
  Administered 2019-04-03: 4 mg via INTRAVENOUS

## 2019-04-03 MED ORDER — LACTATED RINGERS IV SOLN
INTRAVENOUS | Status: DC
  Administered 2019-04-04: via INTRAVENOUS

## 2019-04-03 MED ORDER — HYDRALAZINE HCL 20 MG/ML IJ SOLN: 10.0000 mg | INTRAMUSCULAR | Status: DC | PRN

## 2019-04-03 SURGICAL SUPPLY — 48 items
ADH SKN CLS APL DERMABOND .7 (GAUZE/BANDAGES/DRESSINGS) ×1
APL PRP STRL LF DISP 70% ISPRP (MISCELLANEOUS) ×1
APPLIER CLIP 5 13 M/L LIGAMAX5 (MISCELLANEOUS)
APR CLP MED LRG 5 ANG JAW (MISCELLANEOUS)
BAG SPEC RTRVL LRG 6X4 10 (ENDOMECHANICALS) ×1
BLADE CLIPPER SURG (BLADE) IMPLANT
CANISTER SUCT 3000ML PPV (MISCELLANEOUS) ×3 IMPLANT
CHLORAPREP W/TINT 26 (MISCELLANEOUS) ×3 IMPLANT
CLIP APPLIE 5 13 M/L LIGAMAX5 (MISCELLANEOUS) IMPLANT
COVER SURGICAL LIGHT HANDLE (MISCELLANEOUS) ×3 IMPLANT
COVER WAND RF STERILE (DRAPES) ×3 IMPLANT
CUTTER FLEX LINEAR 45M (STAPLE) ×3 IMPLANT
DECANTER SPIKE VIAL GLASS SM (MISCELLANEOUS) ×2 IMPLANT
DERMABOND ADVANCED (GAUZE/BANDAGES/DRESSINGS) ×2
DERMABOND ADVANCED .7 DNX12 (GAUZE/BANDAGES/DRESSINGS) ×1 IMPLANT
ELECT REM PT RETURN 9FT ADLT (ELECTROSURGICAL) ×3
ELECTRODE REM PT RTRN 9FT ADLT (ELECTROSURGICAL) ×1 IMPLANT
GLOVE BIO SURGEON STRL SZ7.5 (GLOVE) ×3 IMPLANT
GLOVE INDICATOR 8.0 STRL GRN (GLOVE) ×3 IMPLANT
GOWN STRL REUS W/ TWL LRG LVL3 (GOWN DISPOSABLE) ×2 IMPLANT
GOWN STRL REUS W/ TWL XL LVL3 (GOWN DISPOSABLE) ×1 IMPLANT
GOWN STRL REUS W/TWL LRG LVL3 (GOWN DISPOSABLE) ×6
GOWN STRL REUS W/TWL XL LVL3 (GOWN DISPOSABLE) ×3
KIT BASIN OR (CUSTOM PROCEDURE TRAY) ×3 IMPLANT
KIT TURNOVER KIT B (KITS) ×3 IMPLANT
NS IRRIG 1000ML POUR BTL (IV SOLUTION) ×3 IMPLANT
PAD ARMBOARD 7.5X6 YLW CONV (MISCELLANEOUS) ×6 IMPLANT
PENCIL SMOKE EVACUATOR (MISCELLANEOUS) ×3 IMPLANT
POUCH SPECIMEN RETRIEVAL 10MM (ENDOMECHANICALS) ×3 IMPLANT
RELOAD 45 VASCULAR/THIN (ENDOMECHANICALS) IMPLANT
RELOAD STAPLE 45 2.5 WHT GRN (ENDOMECHANICALS) IMPLANT
RELOAD STAPLE 45 3.5 BLU ETS (ENDOMECHANICALS) IMPLANT
RELOAD STAPLE TA45 3.5 REG BLU (ENDOMECHANICALS) ×3 IMPLANT
SCISSORS LAP 5X35 DISP (ENDOMECHANICALS) IMPLANT
SET IRRIG TUBING LAPAROSCOPIC (IRRIGATION / IRRIGATOR) ×3 IMPLANT
SET TUBE SMOKE EVAC HIGH FLOW (TUBING) ×3 IMPLANT
SHEARS HARMONIC ACE PLUS 36CM (ENDOMECHANICALS) ×3 IMPLANT
SLEEVE ENDOPATH XCEL 5M (ENDOMECHANICALS) ×3 IMPLANT
SPECIMEN JAR SMALL (MISCELLANEOUS) ×3 IMPLANT
SUT MNCRL AB 4-0 PS2 18 (SUTURE) ×3 IMPLANT
TOWEL GREEN STERILE (TOWEL DISPOSABLE) ×3 IMPLANT
TOWEL GREEN STERILE FF (TOWEL DISPOSABLE) ×3 IMPLANT
TRAY FOLEY W/BAG SLVR 16FR (SET/KITS/TRAYS/PACK) ×3
TRAY FOLEY W/BAG SLVR 16FR ST (SET/KITS/TRAYS/PACK) ×1 IMPLANT
TRAY LAPAROSCOPIC MC (CUSTOM PROCEDURE TRAY) ×3 IMPLANT
TROCAR XCEL BLUNT TIP 100MML (ENDOMECHANICALS) ×3 IMPLANT
TROCAR XCEL NON-BLD 5MMX100MML (ENDOMECHANICALS) ×3 IMPLANT
WATER STERILE IRR 1000ML POUR (IV SOLUTION) ×3 IMPLANT

## 2019-04-03 NOTE — Transfer of Care (Signed)
Immediate Anesthesia Transfer of Care Note  Patient: Curtis Holland  Procedure(s) Performed: APPENDECTOMY LAPAROSCOPIC (N/A Abdomen)  Patient Location: PACU  Anesthesia Type:General  Level of Consciousness: awake  Airway & Oxygen Therapy: Patient Spontanous Breathing  Post-op Assessment: Report given to RN and Post -op Vital signs reviewed and stable  Post vital signs: Reviewed and stable  Last Vitals:  Vitals Value Taken Time  BP 146/72 04/03/19 2208  Temp    Pulse 117 04/03/19 2209  Resp 21 04/03/19 2209  SpO2 97 % 04/03/19 2209  Vitals shown include unvalidated device data.  Last Pain:  Vitals:   04/03/19 1648  TempSrc: Oral  PainSc:          Complications: No apparent anesthesia complications

## 2019-04-03 NOTE — ED Provider Notes (Signed)
[place] EMERGENCY DEPARTMENT Provider Note   CSN: [id number] Arrival date & time: 04/03/19  1626     History   Chief Complaint Chief Complaint  Patient presents with   Abdominal Pain    HPI Curtis Holland is a 28 y.o. male presenting for evaluation of abdominal pain.  Patient states 2 days ago he started to have periumbilical abdominal pain.  In the past 2 days, pain has started to migrate towards his right lower quadrant.  He reports objective fevers at home.  He reports nausea without vomiting.  He reports a decreased appetite.  Today he had a banana for breakfast, has not had any solid food since then.  He denies cough, chest pain, shortness of breath, urinary symptoms, abnormal bowel movements.  He denies a history of abdominal problems.  No one else at home is sick.  He has no history of abdominal surgeries.  Patient states he has no medical problems, takes no medications daily.  Pain is worse with movement, improved with rest.  He has not taken any medicine for pain including Tylenol or ibuprofen.     HPI  Past Medical History:  Diagnosis Date   History of ear infections     Patient Active Problem List   Diagnosis Date Noted   DEPRESSIVE DISORDER NOT ELSEWHERE CLASSIFIED 01/29/2008   HYPERSOMNIA, ASSOCIATED WITH SLEEP APNEA 01/29/2008    Past Surgical History:  Procedure Laterality Date   TYMPANOSTOMY TUBE PLACEMENT          Home Medications    Prior to Admission medications   Medication Sig Start Date End Date Taking? Authorizing Provider  amoxicillin (AMOXIL) 500 MG capsule Take 1 capsule (500 mg total) by mouth 3 (three) times daily. Patient not taking: Reported on 04/03/2019 09/17/15   Carlisle Cater, PA-C  HYDROcodone-acetaminophen (NORCO/VICODIN) 5-325 MG per tablet Take 1-2 tablets by mouth every 4 (four) hours as needed. Patient not taking: Reported on 04/03/2019 10/03/13   Carman Ching, PA-C    Family History No family  history on file.  Social History Social History   Tobacco Use   Smoking status: Never Smoker  Substance Use Topics   Alcohol use: No   Drug use: Yes    Types: Marijuana     Allergies   Patient has no known allergies.   Review of Systems Review of Systems  Constitutional: Positive for appetite change and fever (Subjective).  Gastrointestinal: Positive for abdominal pain and nausea.  All other systems reviewed and are negative.    Physical Exam Updated Vital Signs BP 124/77    Pulse 84    Temp 99.4 F (37.4 C) (Oral)    Resp 16    SpO2 99%   Physical Exam Vitals signs and nursing note reviewed.  Constitutional:      General: He is not in acute distress.    Appearance: He is well-developed.  HENT:     Head: Normocephalic and atraumatic.  Eyes:     Conjunctiva/sclera: Conjunctivae normal.     Pupils: Pupils are equal, round, and reactive to light.  Neck:     Musculoskeletal: Normal range of motion and neck supple.  Cardiovascular:     Rate and Rhythm: Normal rate and regular rhythm.  Pulmonary:     Effort: Pulmonary effort is normal. No respiratory distress.     Breath sounds: Normal breath sounds. No wheezing.  Abdominal:     General: There is no distension.     Palpations:  Abdomen is soft. There is no mass.     Tenderness: There is abdominal tenderness. There is no guarding or rebound.     Comments: ttp of periumbilical and rlq abd. No rigidity or distention. Negative rebound  Musculoskeletal: Normal range of motion.  Skin:    General: Skin is warm and dry.     Capillary Refill: Capillary refill takes less than 2 seconds.  Neurological:     Mental Status: He is alert and oriented to person, place, and time.      ED Treatments / Results  Labs (all labs ordered are listed, but only abnormal results are displayed) Labs Reviewed  COMPREHENSIVE METABOLIC PANEL - Abnormal; Notable for the following components:      Result Value   Potassium 3.4 (*)     All other components within normal limits  CBC - Abnormal; Notable for the following components:   WBC 12.4 (*)    RBC 5.86 (*)    All other components within normal limits  URINALYSIS, ROUTINE W REFLEX MICROSCOPIC - Abnormal; Notable for the following components:   Ketones, ur 5 (*)    All other components within normal limits  SARS CORONAVIRUS 2 (HOSPITAL ORDER, PERFORMED IN [facility])  LIPASE, BLOOD    EKG None  Radiology Ct Abdomen Pelvis W Contrast  Result Date: 04/03/2019 CLINICAL DATA:  Fever with 2 days of periumbilical and right lower quadrant abdominal pain. EXAM: CT ABDOMEN AND PELVIS WITH CONTRAST TECHNIQUE: Multidetector CT imaging of the abdomen and pelvis was performed using the standard protocol following bolus administration of intravenous contrast. CONTRAST:  100mL OMNIPAQUE IOHEXOL 300 MG/ML  SOLN COMPARISON:  Abdominal ultrasound dated July 22, 2005. FINDINGS: Lower chest: No acute abnormality. Hepatobiliary: No focal liver abnormality is seen. No gallstones, gallbladder wall thickening, or biliary dilatation. Pancreas: Unremarkable. No pancreatic ductal dilatation or surrounding inflammatory changes. Spleen: Normal in size without focal abnormality. Adrenals/Urinary Tract: Adrenal glands are unremarkable. Subcentimeter low-density lesion in the right kidney is too small to characterize. No renal calculi or hydronephrosis. The bladder is unremarkable for the degree of distention. Stomach/Bowel: Dilated distal appendix with mild surrounding inflammatory changes, consistent with acute appendicitis. Appendix: Location: Right lower quadrant, posterior to the cecum Diameter: 12 mm Appendicolith: None Mucosal hyper-enhancement: Present Extraluminal gas: None Periappendiceal collection: None The stomach, small bowel, and colon are unremarkable. Vascular/Lymphatic: No significant vascular findings are present. No enlarged abdominal or pelvic lymph nodes. Reproductive:  Prostate is unremarkable. Other: Tiny fat containing left inguinal hernia. No free fluid or pneumoperitoneum. Musculoskeletal: No acute or significant osseous findings. IMPRESSION: 1. Acute appendicitis.  No perforation or abscess. Electronically Signed   By: Obie DredgeWilliam T Derry M.D.   On: 04/03/2019 19:09    Procedures Procedures (including critical care time)  Medications Ordered in ED Medications  cefTRIAXone (ROCEPHIN) 2 g in sodium chloride 0.9 % 100 mL IVPB (has no administration in time range)    And  metroNIDAZOLE (FLAGYL) IVPB 500 mg (has no administration in time range)  sodium chloride flush (NS) 0.9 % injection 3 mL (3 mLs Intravenous Given 04/03/19 1704)  iohexol (OMNIPAQUE) 300 MG/ML solution 100 mL (100 mLs Intravenous Contrast Given 04/03/19 1835)     Initial Impression / Assessment and Plan / ED Course  I have reviewed the triage vital signs and the nursing notes.  Pertinent labs & imaging results that were available during my care of the patient were reviewed by me and considered in my medical decision making (see  chart for details).        Patient presenting for evaluation of abdominal pain, subjective fevers, and nausea.  Physical exam shows periumbilical and right lower quadrant abdominal tenderness.  Concern for acute appendicitis. Will order labs, covid, and CT abdomen pelvis.  Labs show mild leukocytosis at 12.  Otherwise reassuring.  CT pending.  CT consistent with acute appendectomy without purse.  Will consult with general surgery.  General surgery to admit.  Final Clinical Impressions(s) / ED Diagnoses   Final diagnoses:  Acute appendicitis, unspecified acute appendicitis type    ED Discharge Orders    None       Alveria ApleyCaccavale, Galen Malkowski, PA-C 04/03/19 2058    Gwyneth SproutPlunkett, Whitney, MD 04/03/19 2341

## 2019-04-03 NOTE — Anesthesia Procedure Notes (Signed)
Procedure Name: Intubation Date/Time: 04/03/2019 8:53 PM Performed by: Clovis Cao, CRNA Pre-anesthesia Checklist: Patient identified, Emergency Drugs available, Suction available, Patient being monitored and Timeout performed Patient Re-evaluated:Patient Re-evaluated prior to induction Oxygen Delivery Method: Circle system utilized Preoxygenation: Pre-oxygenation with 100% oxygen Induction Type: IV induction, Rapid sequence and Cricoid Pressure applied Laryngoscope Size: Miller and 2 Grade View: Grade I Tube type: Oral Tube size: 7.5 mm Number of attempts: 1 Airway Equipment and Method: Stylet Placement Confirmation: ETT inserted through vocal cords under direct vision,  positive ETCO2 and breath sounds checked- equal and bilateral Secured at: 22 cm Tube secured with: Tape Dental Injury: Teeth and Oropharynx as per pre-operative assessment

## 2019-04-03 NOTE — H&P (Signed)
CC/Reason for consult: Acute appendicitis, consult by ED-P  HPI: Curtis Holland is an 28 y.o. male with no known PMH presents to ED with acute onset abdominal pain. Began 2d ago and was periumbilical. Subsequently has localized to RLQ. Pain is sharp, constant, does not radiate. Nothing makes it better/worse. Has had associated chills, no fevers. He denies ever having had this before. Denies emesis.   Past Medical History:  Diagnosis Date   History of ear infections     Past Surgical History:  Procedure Laterality Date   TYMPANOSTOMY TUBE PLACEMENT      No family history on file.  Social:  reports that he has never smoked. He does not have any smokeless tobacco history on file. He reports current drug use. Drug: Marijuana. He reports that he does not drink alcohol.  Allergies: No Known Allergies  Medications: I have reviewed the patient's current medications.  Results for orders placed or performed during the hospital encounter of 04/03/19 (from the past 48 hour(s))  Lipase, blood     Status: None   Collection Time: 04/03/19  5:02 PM  Result Value Ref Range   Lipase 34 11 - 51 U/L    Comment: Performed at [facility], [address] [place], [place] [address]  Comprehensive metabolic panel     Status: Abnormal   Collection Time: 04/03/19  5:02 PM  Result Value Ref Range   Sodium 140 135 - 145 mmol/L   Potassium 3.4 (L) 3.5 - 5.1 mmol/L   Chloride 105 98 - 111 mmol/L   CO2 23 22 - 32 mmol/L   Glucose, Bld 99 70 - 99 mg/dL   BUN 6 6 - 20 mg/dL   Creatinine, Ser 5.951.04 0.61 - 1.24 mg/dL   Calcium 9.8 8.9 - 63.810.3 mg/dL   Total Protein 7.7 6.5 - 8.1 g/dL   Albumin 4.6 3.5 - 5.0 g/dL   AST 25 15 - 41 U/L   ALT 29 0 - 44 U/L   Alkaline Phosphatase 86 38 - 126 U/L   Total Bilirubin 1.0 0.3 - 1.2 mg/dL   GFR calc non Af Amer >60 >60 mL/min   GFR calc Af Amer >60 >60 mL/min   Anion gap 12 5 - 15    Comment: Performed at [facility], [address]  [place], [place] [address]  CBC     Status: Abnormal   Collection Time: 04/03/19  5:02 PM  Result Value Ref Range   WBC 12.4 (H) 4.0 - 10.5 K/uL   RBC 5.86 (H) 4.22 - 5.81 MIL/uL   Hemoglobin 15.9 13.0 - 17.0 g/dL   HCT 32.948.0 51.839.0 - 84.152.0 %   MCV 81.9 80.0 - 100.0 fL   MCH 27.1 26.0 - 34.0 pg   MCHC 33.1 30.0 - 36.0 g/dL   RDW 66.012.5 63.011.5 - 16.015.5 %   Platelets 276 150 - 400 K/uL   nRBC 0.0 0.0 - 0.2 %    Comment: Performed at [facility], [address] [place], [place] [address]  SARS Coronavirus 2 (CEPHEID - Performed in [facility] hospital lab), Hosp Order     Status: None   Collection Time: 04/03/19  5:36 PM   Specimen: Nasopharyngeal Swab  Result Value Ref Range   SARS Coronavirus 2 NEGATIVE NEGATIVE    Comment: (NOTE) If result is NEGATIVE SARS-CoV-2 target nucleic acids are NOT DETECTED. The SARS-CoV-2 RNA is generally detectable in upper and lower  respiratory specimens  during the acute phase of infection. The lowest  concentration of SARS-CoV-2 viral copies this assay can detect is 250  copies / mL. A negative result does not preclude SARS-CoV-2 infection  and should not be used as the sole basis for treatment or other  patient management decisions.  A negative result may occur with  improper specimen collection / handling, submission of specimen other  than nasopharyngeal swab, presence of viral mutation(s) within the  areas targeted by this assay, and inadequate number of viral copies  (<250 copies / mL). A negative result must be combined with clinical  observations, patient history, and epidemiological information. If result is POSITIVE SARS-CoV-2 target nucleic acids are DETECTED. The SARS-CoV-2 RNA is generally detectable in upper and lower  respiratory specimens dur ing the acute phase of infection.  Positive  results are indicative of active infection with SARS-CoV-2.  Clinical  correlation with patient history and other diagnostic information is  necessary to  determine patient infection status.  Positive results do  not rule out bacterial infection or co-infection with other viruses. If result is PRESUMPTIVE POSTIVE SARS-CoV-2 nucleic acids MAY BE PRESENT.   A presumptive positive result was obtained on the submitted specimen  and confirmed on repeat testing.  While 2019 novel coronavirus  (SARS-CoV-2) nucleic acids may be present in the submitted sample  additional confirmatory testing may be necessary for epidemiological  and / or clinical management purposes  to differentiate between  SARS-CoV-2 and other Sarbecovirus currently known to infect humans.  If clinically indicated additional testing with an alternate test  methodology [phone]([id number]) is advised. The SARS-CoV-2 RNA is generally  detectable in upper and lower respiratory sp ecimens during the acute  phase of infection. The expected result is Negative. Fact Sheet for Patients:  BoilerBrush.com.cyhttps://www.fda.gov/media/136312/download Fact Sheet for Healthcare Providers: https://pope.com/https://www.fda.gov/media/136313/download This test is not yet approved or cleared by the [place] FDA and has been authorized for detection and/or diagnosis of SARS-CoV-2 by FDA under an Emergency Use Authorization (EUA).  This EUA will remain in effect (meaning this test can be used) for the duration of the COVID-19 declaration under Section 564(b)(1) of the Act, 21 U.S.C. section 360bbb-3(b)(1), unless the authorization is terminated or revoked sooner. Performed at [facility], [address] [place], [place] [address]   Urinalysis, Routine w reflex microscopic     Status: Abnormal   Collection Time: 04/03/19  5:55 PM  Result Value Ref Range   Color, Urine YELLOW YELLOW   APPearance CLEAR CLEAR   Specific Gravity, Urine 1.020 1.005 - 1.030   pH 6.0 5.0 - 8.0   Glucose, UA NEGATIVE NEGATIVE mg/dL   Hgb urine dipstick NEGATIVE NEGATIVE   Bilirubin Urine NEGATIVE NEGATIVE   Ketones, ur 5 (A) NEGATIVE mg/dL    Protein, ur NEGATIVE NEGATIVE mg/dL   Nitrite NEGATIVE NEGATIVE   Leukocytes,Ua NEGATIVE NEGATIVE    Comment: Performed at [facility], [address] [place], [place] [address]    Ct Abdomen Pelvis W Contrast  Result Date: 04/03/2019 CLINICAL DATA:  Fever with 2 days of periumbilical and right lower quadrant abdominal pain. EXAM: CT ABDOMEN AND PELVIS WITH CONTRAST TECHNIQUE: Multidetector CT imaging of the abdomen and pelvis was performed using the standard protocol following bolus administration of intravenous contrast. CONTRAST:  100mL OMNIPAQUE IOHEXOL 300 MG/ML  SOLN COMPARISON:  Abdominal ultrasound dated July 22, 2005. FINDINGS: Lower chest: No acute abnormality. Hepatobiliary: No focal liver abnormality is seen. No gallstones, gallbladder wall  thickening, or biliary dilatation. Pancreas: Unremarkable. No pancreatic ductal dilatation or surrounding inflammatory changes. Spleen: Normal in size without focal abnormality. Adrenals/Urinary Tract: Adrenal glands are unremarkable. Subcentimeter low-density lesion in the right kidney is too small to characterize. No renal calculi or hydronephrosis. The bladder is unremarkable for the degree of distention. Stomach/Bowel: Dilated distal appendix with mild surrounding inflammatory changes, consistent with acute appendicitis. Appendix: Location: Right lower quadrant, posterior to the cecum Diameter: 12 mm Appendicolith: None Mucosal hyper-enhancement: Present Extraluminal gas: None Periappendiceal collection: None The stomach, small bowel, and colon are unremarkable. Vascular/Lymphatic: No significant vascular findings are present. No enlarged abdominal or pelvic lymph nodes. Reproductive: Prostate is unremarkable. Other: Tiny fat containing left inguinal hernia. No free fluid or pneumoperitoneum. Musculoskeletal: No acute or significant osseous findings. IMPRESSION: 1. Acute appendicitis.  No perforation or abscess. Electronically Signed   By:  Titus Dubin M.D.   On: 04/03/2019 19:09    ROS - all of the below systems have been reviewed with the patient and positives are indicated with bold text General: chills, fever or night sweats Eyes: blurry vision or double vision ENT: epistaxis or sore throat Allergy/Immunology: itchy/watery eyes or nasal congestion Hematologic/Lymphatic: bleeding problems, blood clots or swollen lymph nodes Endocrine: temperature intolerance or unexpected weight changes Breast: new or changing breast lumps or nipple discharge Resp: cough, shortness of breath, or wheezing CV: chest pain or dyspnea on exertion GI: as per HPI GU: dysuria, trouble voiding, or hematuria MSK: joint pain or joint stiffness Neuro: TIA or stroke symptoms Derm: pruritus and skin lesion changes Psych: anxiety and depression  PE Blood pressure 124/77, pulse 84, temperature 99.4 F (37.4 C), temperature source Oral, resp. rate 16, SpO2 99 %. Constitutional: NAD; conversant; no deformities Eyes: Moist conjunctiva; no lid lag; anicteric; PERRL Neck: Trachea midline; no thyromegaly Lungs: Normal respiratory effort; no tactile fremitus CV: RRR; no palpable thrills; no pitting edema GI: Abd soft, mildly ttp in RLQ; no rebound/guarding; no palpable hepatosplenomegaly MSK: Normal gait; no clubbing/cyanosis Psychiatric: Appropriate affect; alert and oriented x3 Lymphatic: No palpable cervical or axillary lymphadenopathy  Results for orders placed or performed during the hospital encounter of 04/03/19 (from the past 48 hour(s))  Lipase, blood     Status: None   Collection Time: 04/03/19  5:02 PM  Result Value Ref Range   Lipase 34 11 - 51 U/L    Comment: Performed at [facility], [place] [address], [place], [place] [address]  Comprehensive metabolic panel     Status: Abnormal   Collection Time: 04/03/19  5:02 PM  Result Value Ref Range   Sodium 140 135 - 145 mmol/L   Potassium 3.4 (L) 3.5 - 5.1 mmol/L   Chloride 105 98  - 111 mmol/L   CO2 23 22 - 32 mmol/L   Glucose, Bld 99 70 - 99 mg/dL   BUN 6 6 - 20 mg/dL   Creatinine, Ser 1.04 0.61 - 1.24 mg/dL   Calcium 9.8 8.9 - 10.3 mg/dL   Total Protein 7.7 6.5 - 8.1 g/dL   Albumin 4.6 3.5 - 5.0 g/dL   AST 25 15 - 41 U/L   ALT 29 0 - 44 U/L   Alkaline Phosphatase 86 38 - 126 U/L   Total Bilirubin 1.0 0.3 - 1.2 mg/dL   GFR calc non Af Amer >60 >60 mL/min   GFR calc Af Amer >60 >60 mL/min   Anion gap 12 5 - 15    Comment: Performed at [facility], [address] [place], [place] [address]  CBC     Status: Abnormal   Collection Time: 04/03/19  5:02 PM  Result Value Ref Range   WBC 12.4 (H) 4.0 - 10.5 K/uL   RBC 5.86 (H) 4.22 - 5.81 MIL/uL   Hemoglobin 15.9 13.0 - 17.0 g/dL   HCT 60.4 54.0 - 98.1 %   MCV 81.9 80.0 - 100.0 fL   MCH 27.1 26.0 - 34.0 pg   MCHC 33.1 30.0 - 36.0 g/dL   RDW 19.1 47.8 - 29.5 %   Platelets 276 150 - 400 K/uL   nRBC 0.0 0.0 - 0.2 %    Comment: Performed at [facility], [address], [place], [place] [address]  SARS Coronavirus 2 (CEPHEID - Performed in [facility] hospital lab), Hosp Order     Status: None   Collection Time: 04/03/19  5:36 PM   Specimen: Nasopharyngeal Swab  Result Value Ref Range   SARS Coronavirus 2 NEGATIVE NEGATIVE    Comment: (NOTE) If result is NEGATIVE SARS-CoV-2 target nucleic acids are NOT DETECTED. The SARS-CoV-2 RNA is generally detectable in upper and lower  respiratory specimens during the acute phase of infection. The lowest  concentration of SARS-CoV-2 viral copies this assay can detect is 250  copies / mL. A negative result does not preclude SARS-CoV-2 infection  and should not be used as the sole basis for treatment or other  patient management decisions.  A negative result may occur with  improper specimen collection / handling, submission of specimen other  than nasopharyngeal swab, presence of viral mutation(s) within the  areas targeted by this assay, and inadequate  number of viral copies  (<250 copies / mL). A negative result must be combined with clinical  observations, patient history, and epidemiological information. If result is POSITIVE SARS-CoV-2 target nucleic acids are DETECTED. The SARS-CoV-2 RNA is generally detectable in upper and lower  respiratory specimens dur ing the acute phase of infection.  Positive  results are indicative of active infection with SARS-CoV-2.  Clinical  correlation with patient history and other diagnostic information is  necessary to determine patient infection status.  Positive results do  not rule out bacterial infection or co-infection with other viruses. If result is PRESUMPTIVE POSTIVE SARS-CoV-2 nucleic acids MAY BE PRESENT.   A presumptive positive result was obtained on the submitted specimen  and confirmed on repeat testing.  While 2019 novel coronavirus  (SARS-CoV-2) nucleic acids may be present in the submitted sample  additional confirmatory testing may be necessary for epidemiological  and / or clinical management purposes  to differentiate between  SARS-CoV-2 and other Sarbecovirus currently known to infect humans.  If clinically indicated additional testing with an alternate test  methodology [phone]) is advised. The SARS-CoV-2 RNA is generally  detectable in upper and lower respiratory sp ecimens during the acute  phase of infection. The expected result is Negative. Fact Sheet for Patients:  BoilerBrush.com.cy Fact Sheet for Healthcare Providers: https://pope.com/ This test is not yet approved or cleared by the [place] FDA and has been authorized for detection and/or diagnosis of SARS-CoV-2 by FDA under an Emergency Use Authorization (EUA).  This EUA will remain in effect (meaning this test can be used) for the duration of the COVID-19 declaration under Section 564(b)(1) of the Act, 21 U.S.C. section 360bbb-3(b)(1), unless the  authorization is terminated or revoked sooner. Performed at [facility], [address] [place], [place] [address]  Urinalysis, Routine w reflex microscopic     Status: Abnormal   Collection Time: 04/03/19  5:55 PM  Result Value Ref Range   Color, Urine YELLOW YELLOW   APPearance CLEAR CLEAR   Specific Gravity, Urine 1.020 1.005 - 1.030   pH 6.0 5.0 - 8.0   Glucose, UA NEGATIVE NEGATIVE mg/dL   Hgb urine dipstick NEGATIVE NEGATIVE   Bilirubin Urine NEGATIVE NEGATIVE   Ketones, ur 5 (A) NEGATIVE mg/dL   Protein, ur NEGATIVE NEGATIVE mg/dL   Nitrite NEGATIVE NEGATIVE   Leukocytes,Ua NEGATIVE NEGATIVE    Comment: Performed at [facility], [address], [place], [place] [address]    Ct Abdomen Pelvis W Contrast  Result Date: 04/03/2019 CLINICAL DATA:  Fever with 2 days of periumbilical and right lower quadrant abdominal pain. EXAM: CT ABDOMEN AND PELVIS WITH CONTRAST TECHNIQUE: Multidetector CT imaging of the abdomen and pelvis was performed using the standard protocol following bolus administration of intravenous contrast. CONTRAST:  OMNIPAQUE IOHEXOL 300 MG/ML  SOLN COMPARISON:  Abdominal ultrasound dated July 22, 2005. FINDINGS: Lower chest: No acute abnormality. Hepatobiliary: No focal liver abnormality is seen. No gallstones, gallbladder wall thickening, or biliary dilatation. Pancreas: Unremarkable. No pancreatic ductal dilatation or surrounding inflammatory changes. Spleen: Normal in size without focal abnormality. Adrenals/Urinary Tract: Adrenal glands are unremarkable. Subcentimeter low-density lesion in the right kidney is too small to characterize. No renal calculi or hydronephrosis. The bladder is unremarkable for the degree of distention. Stomach/Bowel: Dilated distal appendix with mild surrounding inflammatory changes, consistent with acute appendicitis. Appendix: Location: Right lower quadrant, posterior to the cecum Diameter: 12 mm Appendicolith: None  Mucosal hyper-enhancement: Present Extraluminal gas: None Periappendiceal collection: None The stomach, small bowel, and colon are unremarkable. Vascular/Lymphatic: No significant vascular findings are present. No enlarged abdominal or pelvic lymph nodes. Reproductive: Prostate is unremarkable. Other: Tiny fat containing left inguinal hernia. No free fluid or pneumoperitoneum. Musculoskeletal: No acute or significant osseous findings. IMPRESSION: 1. Acute appendicitis.  No perforation or abscess. Electronically Signed   By: Obie Dredge M.D.   On: 04/03/2019 19:09   A/P: Curtis Holland is an 28 y.o. male with acute appendicitis  -The anatomy and physiology of the GI tract was discussed at length with the patient. The pathophysiology of appendicitis was discussed at length as well. -I discussed options moving forward for treatment including observation with IV abx vs surgery. We discussed that with antibiotics alone, there is reasonable success in managing appendicitis without needing an operation per se. We discussed risks of recurrence at 36yrs being as high as 40% in some studies (APPAC trial in cases of acute uncomplicated appendicitis - exclusions being perforation, abscess, suspected malignancy, appenddicolith, age <18 or >60, peritonitis, serious comorbidities). We discussed the surgical procedure including laparoscopic and potential open techniques to appendectomy. We discussed the material risks (including, but not limited to, pain, bleeding, infection, scarring, need for blood transfusion, damage to surrounding structures- blood vessels/nerves/viscus/organs, damage to ureter/bladder, urine leak, leak from staple line, need for additional procedures, hernia, recurrence although quite low, pneumonia, heart attack, stroke, death) benefits and alternatives to surgery were discussed at length. The patient's questions were answered to his satisfaction, he voiced understanding and elected to proceed with  surgery. 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 Will not be able to lift more than 10-15lbs for the next 6-8 weeks. He expressed understanding -He requested I update  his father, Curtis Holland, after we finish - [phone]  Stephanie Couphristopher M. Cliffton AstersWhite, M.D. [facility]

## 2019-04-03 NOTE — Anesthesia Preprocedure Evaluation (Addendum)
Anesthesia Evaluation  Patient identified by MRN, date of birth, ID band Patient awake    Reviewed: Allergy & Precautions, H&P , NPO status , Patient's Chart, lab work & pertinent test results  Airway Mallampati: II  TM Distance: >3 FB Neck ROM: Full    Dental no notable dental hx. (+) Teeth Intact, Dental Advisory Given   Pulmonary neg pulmonary ROS,    Pulmonary exam normal breath sounds clear to auscultation       Cardiovascular negative cardio ROS   Rhythm:Regular Rate:Normal     Neuro/Psych Depression negative neurological ROS  negative psych ROS   GI/Hepatic negative GI ROS, Neg liver ROS,   Endo/Other  negative endocrine ROS  Renal/GU negative Renal ROS  negative genitourinary   Musculoskeletal   Abdominal   Peds  Hematology negative hematology ROS (+)   Anesthesia Other Findings   Reproductive/Obstetrics negative OB ROS                            Anesthesia Physical Anesthesia Plan  ASA: II  Anesthesia Plan: General   Post-op Pain Management:    Induction: Intravenous, Rapid sequence and Cricoid pressure planned  PONV Risk Score and Plan: 3 and Ondansetron, Dexamethasone and Midazolam  Airway Management Planned: Oral ETT  Additional Equipment:   Intra-op Plan:   Post-operative Plan: Extubation in OR  Informed Consent: I have reviewed the patients History and Physical, chart, labs and discussed the procedure including the risks, benefits and alternatives for the proposed anesthesia with the patient or authorized representative who has indicated his/her understanding and acceptance.     Dental advisory given  Plan Discussed with: CRNA, Anesthesiologist and Surgeon  Anesthesia Plan Comments:        Anesthesia Quick Evaluation

## 2019-04-03 NOTE — ED Notes (Signed)
Patient transported to CT 

## 2019-04-03 NOTE — Op Note (Signed)
Curtis Holland [id number]   PRE-OPERATIVE DIAGNOSIS:  Acute appendicitis  POST-OPERATIVE DIAGNOSIS:  Acute appendicitis without perforation or gangrene  Procedure(s): APPENDECTOMY LAPAROSCOPIC  PROCEDURE: Laparoscopic appendectomy  SURGEON:  Stephanie Couphristopher M. Cliffton AstersWhite, M.D.  ANESTHESIA: General endotracheal  EBL:   5 mL  DRAINS: None  SPECIMEN:  Appendix  COUNTS:  Sponge, needle and instrument counts were reported correct x2 at conclusion of the operation  DISPOSITION:  PACU in satisfactory condition  COMPLICATIONS: None  FINDINGS: Acute inflamed appearing appendix distally; base healthy and viable.  No abscess. Some free fluid in pelvis.  INDICATIONS: Curtis Holland is an 28 y.o. male with no known PMH presents to ED with acute onset abdominal pain. Began 2d ago and was periumbilical. Subsequently has localized to RLQ. Pain is sharp, constant, does not radiate. Nothing makes it better/worse. Has had associated chills, no fevers. He denies ever having had this before. Denies emesis.  He exhibited tenderness in RLQ on exam  WBC 12.4  CT A/P demonstrated findings consistent with acute uncomplicated appendicitis. Options were discussed and he opted to pursue surgery. Please refer to notes elsewhere for details regarding this discussion  DESCRIPTION:   The patient was identified & brought into the operating room. SCDs were in place and functioning. General endotracheal anesthesia was administered. Preoperative antibiotics were administered. The patient was positioned supine with left arm tucked. Hair on the abdomen was then clipped by the OR team. A foley catheter was inserted under sterile conditions. The abdomen was prepped and draped in the standard sterile fashion. A surgical timeout confirmed our plan.  A small incision was made in the infraumbilical fold. The subcutaneous tissue was dissected and the umbilical stalk identified. The stalk was grasped with a Kocher and retracted  outwardly. The infraumbilical fascia was exposed and incised. Peritoneal entry was carefully made bluntly. A 0 Vicryl purse-string suture was placed and then the [facility] was introduced into the abdomen.  CO2 insufflation commenced to 15mmHg. The laparoscope was inserted and confirmed no evidence of trocar site complications. The patient was then positioned in Trendelenburg. Two additional ports were placed - one in left lower quadrant and another in the suprapubic midline taking care to stay well above the bladder - 3 fingerbreadths above the pubic symphysis. The bed was then slightly tilted to place the left side down.  The terminal ileum was identified. This was gently mobilized sharply up to the cecum in a lateral to medial fashion. Care was taken to avoid injuring any retroperitoneal structures. The appendix was identified and attachments to the appendix to the surrounding tissues were freed bluntly without difficulty.  The appendix was elevated.  The base of the appendix was circumferentially dissected taking care to preserve the cecum free of injury. The base was noted to be viable and healthy appearing. The distal portion was acutely inflamed. The appendix and mesentery folded back on itself. The terminal ileum, cecum and ascending colon also appeared normal. The base of the appendix was then stapled with a blue load, taking a small healthy cuff of viable cecum, taking care to stay clear of the ileocecal valve. The mesoappendix was then ligated by "hugging" the appendix using the harmonic scalpel. The mesoappendix was inspected and noted to be hemostatic. The appendix was placed in an EndoBag.  The right lower quadrant was conservatively irrigated. The free fluid present in the pelvis was evacuated. Hemostasis was noted to be achieved - taking time to inspect the ligated mesoappendix, colon mesentery, and retroperitoneum.  Staple line was noted to be intact on the cecum with no bleeding. There was no  perforation or injury. The right lower quadrant appeared clean and as such, no drain was placed.  The left lower quadrant and suprapubic ports were removed under direct visualization. The EndoBag was then removed through the umbilical port site and passed off as specimen. The CO2 was exhausted from the abdomen. The umbilical fascia was then closed by closing the 0 Vicryl suture. The fascia was palpated and noted to be completely closed. The skin of all port sites was then approximated using 4-0 Monocryl suture. The incisions were covered with Dermabond.  He was then awakened from general anesthesia, extubated, and transferred to a stretcher for transport to recover in satisfactory condition.  I updated his father over the phone as he had requested preoperatively.

## 2019-04-03 NOTE — ED Triage Notes (Signed)
Onset 2 days ago abd pain at umbilicus, next day pain was below umbilicus and moved to right of abd and fever.  No vomiting.  Pain worse when standing, better when sitting.

## 2019-04-04 ENCOUNTER — Encounter (HOSPITAL_COMMUNITY): Payer: Self-pay | Admitting: Surgery

## 2019-04-04 LAB — CBC
HCT: 45.1 % (ref 39.0–52.0)
Hemoglobin: 15 g/dL (ref 13.0–17.0)
MCH: 27.2 pg (ref 26.0–34.0)
MCHC: 33.3 g/dL (ref 30.0–36.0)
MCV: 81.7 fL (ref 80.0–100.0)
Platelets: 229 10*3/uL (ref 150–400)
RBC: 5.52 MIL/uL (ref 4.22–5.81)
RDW: 12.4 % (ref 11.5–15.5)
WBC: 8.9 10*3/uL (ref 4.0–10.5)
nRBC: 0 % (ref 0.0–0.2)

## 2019-04-04 MED ORDER — ACETAMINOPHEN 325 MG PO TABS: 650.0000 mg | ORAL_TABLET | Freq: Four times a day (QID) | ORAL | Status: DC

## 2019-04-04 MED ORDER — IBUPROFEN 600 MG PO TABS: 600.0000 mg | ORAL_TABLET | Freq: Four times a day (QID) | ORAL | 0 refills | Status: DC | PRN

## 2019-04-04 MED ORDER — TRAMADOL HCL 50 MG PO TABS: 50.0000 mg | ORAL_TABLET | Freq: Four times a day (QID) | ORAL | 0 refills | Status: DC | PRN

## 2019-04-04 NOTE — Progress Notes (Signed)
RN gave patient discharge instructions and removed his IV. Pt did not have any questions his prescription was escribed over to his home pharmacy. Pt is getting dressed and awaiting his ride.

## 2019-04-04 NOTE — Discharge Summary (Signed)
     Patient ID: Curtis Holland [id number] [birth year] 28 y.o.  Admit date: 04/03/2019 Discharge date: 04/04/2019  Admitting Diagnosis: Acute appendicitis  Discharge Diagnosis Patient Active Problem List   Diagnosis Date Noted  . S/P laparoscopic appendectomy 04/03/2019  . DEPRESSIVE DISORDER NOT ELSEWHERE CLASSIFIED 01/29/2008  . HYPERSOMNIA, ASSOCIATED WITH SLEEP APNEA 01/29/2008    Consultants none  Reason for Admission: DENT PLANTZ is an 28 y.o. male with no known PMH presents to ED with acute onset abdominal pain. Began 2d ago and was periumbilical. Subsequently has localized to RLQ. Pain is sharp, constant, does not radiate. Nothing makes it better/worse. Has had associated chills, no fevers. He denies ever having had this before. Denies emesis.  Procedures Lap appy, Dr. Dema Severin 04/03/19  Hospital Course:  The patient was admitted and underwent a laparoscopic appendectomy.  The patient tolerated the procedure well.  On POD 1, the patient was tolerating a regular diet, voiding well, mobilizing, and pain was controlled with oral pain medications.  The patient was stable for DC home at this time with appropriate follow up made.     Physical Exam: Abd: soft, appropriately tender, +BS, incisions c/d/i with dermabond present  Allergies as of 04/04/2019   No Known Allergies     Medication List    STOP taking these medications   amoxicillin 500 MG capsule Commonly known as: AMOXIL   HYDROcodone-acetaminophen 5-325 MG tablet Commonly known as: NORCO/VICODIN     TAKE these medications   acetaminophen 325 MG tablet Commonly known as: TYLENOL Take 2 tablets (650 mg total) by mouth every 6 (six) hours.   ibuprofen 600 MG tablet Commonly known as: ADVIL Take 1 tablet (600 mg total) by mouth every 6 (six) hours as needed (pain not controlled with tylneol).   traMADol 50 MG tablet Commonly known as: ULTRAM Take 1 tablet (50 mg total) by mouth every 6 (six) hours as  needed (pain not controlled with ibuprofen).        Follow-up Information    [facility] [place] Follow up in 3 week(s).   Specialty: General Surgery Why: our office will call you with appointmemt date and time.   Contact information: [address] [place] [address] [phone]           Signed: Saverio Danker, [facility] 04/04/2019, 10:38 AM Pager: [phone]

## 2019-04-04 NOTE — Anesthesia Postprocedure Evaluation (Signed)
Anesthesia Post Note  Patient: Curtis Holland  Procedure(s) Performed: APPENDECTOMY LAPAROSCOPIC (N/A Abdomen)     Patient location during evaluation: PACU Anesthesia Type: General Level of consciousness: awake and alert Pain management: pain level controlled Vital Signs Assessment: post-procedure vital signs reviewed and stable Respiratory status: spontaneous breathing, nonlabored ventilation and respiratory function stable Cardiovascular status: blood pressure returned to baseline and stable Postop Assessment: no apparent nausea or vomiting Anesthetic complications: no    Last Vitals:  Vitals:   04/03/19 2259 04/04/19 0402  BP: 125/67 108/70  Pulse: 100 87  Resp: 16 18  Temp: 36.8 C 36.5 C  SpO2: 95% 100%    Last Pain:  Vitals:   04/04/19 0402  TempSrc: Oral  PainSc:                  Shawntae Lowy,W. EDMOND

## 2019-04-04 NOTE — Discharge Instructions (Signed)
[email or web address]  Email this address if you have any questions or concerns regarding your incisions  [facility]  Please arrive at least 30 min before your appointment to complete your check in paperwork.  If you are unable to arrive 30 min prior to your appointment time we may have to cancel or reschedule you. LAPAROSCOPIC SURGERY: POST OP INSTRUCTIONS Always review your discharge instruction sheet given to you by the facility where your surgery was performed. IF YOU HAVE DISABILITY OR FAMILY LEAVE FORMS, YOU MUST BRING THEM TO THE OFFICE FOR PROCESSING.   DO NOT GIVE THEM TO YOUR DOCTOR.  PAIN CONTROL  1. First take acetaminophen (Tylenol) AND/or ibuprofen (Advil) to control your pain after surgery.  Follow directions on package.  Taking acetaminophen (Tylenol) and/or ibuprofen (Advil) regularly after surgery will help to control your pain and lower the amount of prescription pain medication you may need.  You should not take more than 4,000 mg (4 grams) of acetaminophen (Tylenol) in 24 hours.  You should not take ibuprofen (Advil), aleve, motrin, naprosyn or other NSAIDS if you have a history of stomach ulcers or chronic kidney disease.  2. A prescription for pain medication may be given to you upon discharge.  Take your pain medication as prescribed, if you still have uncontrolled pain after taking acetaminophen (Tylenol) or ibuprofen (Advil). 3. Use ice packs to help control pain. 4. If you need a refill on your pain medication, please contact your pharmacy.  They will contact our office to request authorization. Prescriptions will not be filled after 5pm or on week-ends.  HOME MEDICATIONS 5. Take your usually prescribed medications unless otherwise directed.  DIET 6. You should follow a light diet the first few days after arrival home.  Be sure to include lots of fluids daily. Avoid fatty, fried foods.   CONSTIPATION 7. It is common to experience  some constipation after surgery and if you are taking pain medication.  Increasing fluid intake and taking a stool softener (such as Colace) will usually help or prevent this problem from occurring.  A mild laxative (Milk of Magnesia or Miralax) should be taken according to package instructions if there are no bowel movements after 48 hours.  WOUND/INCISION CARE 8. Most patients will experience some swelling and bruising in the area of the incisions.  Ice packs will help.  Swelling and bruising can take several days to resolve.  9. Unless discharge instructions indicate otherwise, follow guidelines below  a. STERI-STRIPS - you may remove your outer bandages 48 hours after surgery, and you may shower at that time.  You have steri-strips (small skin tapes) in place directly over the incision.  These strips should be left on the skin for 7-10 days.   b. DERMABOND/SKIN GLUE - you may shower in 24 hours.  The glue will flake off over the next 2-3 weeks. 10. Any sutures or staples will be removed at the office during your follow-up visit.  ACTIVITIES 11. You may resume regular (light) daily activities beginning the next day--such as daily self-care, walking, climbing stairs--gradually increasing activities as tolerated.  You may have sexual intercourse when it is comfortable.  Refrain from any heavy lifting or straining until approved by your doctor. a. You may drive when you are no longer taking prescription pain medication, you can comfortably wear a seatbelt, and you can safely maneuver your car and apply brakes.  FOLLOW-UP 12. You should see your doctor in the office for a follow-up appointment  approximately 2-3 weeks after your surgery.  You should have been given your post-op/follow-up appointment when your surgery was scheduled.  If you did not receive a post-op/follow-up appointment, make sure that you call for this appointment within a day or two after you arrive home to insure a convenient  appointment time.   WHEN TO CALL YOUR DOCTOR: 1. Fever over 101.0 2. Inability to urinate 3. Continued bleeding from incision. 4. Increased pain, redness, or drainage from the incision. 5. Increasing abdominal pain  The clinic staff is available to answer your questions during regular business hours.  Please dont hesitate to call and ask to speak to one of the nurses for clinical concerns.  If you have a medical emergency, go to the nearest emergency room or call 911.  A surgeon from [facility] is always on call at the hospital. [address], [place], [place], [place]  [address] ? [address], [place], [place]   [address] [phone] ? [phone] ? FAX [phone]    Managing Your Pain After Surgery Without Opioids    Thank you for participating in our program to help patients manage their pain after surgery without opioids. This is part of our effort to provide you with the best care possible, without exposing you or your family to the risk that opioids pose.  What pain can I expect after surgery? You can expect to have some pain after surgery. This is normal. The pain is typically worse the day after surgery, and quickly begins to get better. Many studies have found that many patients are able to manage their pain after surgery with Over-the-Counter (OTC) medications such as Tylenol and Motrin. If you have a condition that does not allow you to take Tylenol or Motrin, notify your surgical team.  How will I manage my pain? The best strategy for controlling your pain after surgery is around the clock pain control with Tylenol (acetaminophen) and Motrin (ibuprofen or Advil). Alternating these medications with each other allows you to maximize your pain control. In addition to Tylenol and Motrin, you can use heating pads or ice packs on your incisions to help reduce your pain.  How will I alternate your regular strength over-the-counter pain  medication? You will take a dose of pain medication every three hours. ; Start by taking 650 mg of Tylenol (2 pills of 325 mg) ; 3 hours later take 600 mg of Motrin (3 pills of 200 mg) ; 3 hours after taking the Motrin take 650 mg of Tylenol ; 3 hours after that take 600 mg of Motrin.   - 1 -  See example - if your first dose of Tylenol is at 12:00 PM   12:00 PM Tylenol 650 mg (2 pills of 325 mg)  3:00 PM Motrin 600 mg (3 pills of 200 mg)  6:00 PM Tylenol 650 mg (2 pills of 325 mg)  9:00 PM Motrin 600 mg (3 pills of 200 mg)  Continue alternating every 3 hours   We recommend that you follow this schedule around-the-clock for at least 3 days after surgery, or until you feel that it is no longer needed. Use the table on the last page of this handout to keep track of the medications you are taking. Important: Do not take more than 3000mg  of Tylenol or 3200mg  of Motrin in a 24-hour period. Do not take ibuprofen/Motrin if you have a history of bleeding stomach ulcers, severe kidney disease, &/or actively taking a blood  thinner  What if I still have pain? If you have pain that is not controlled with the over-the-counter pain medications (Tylenol and Motrin or Advil) you might have what we call breakthrough pain. You will receive a prescription for a small amount of an opioid pain medication such as Oxycodone, Tramadol, or Tylenol with Codeine. Use these opioid pills in the first 24 hours after surgery if you have breakthrough pain. Do not take more than 1 pill every 4-6 hours.  If you still have uncontrolled pain after using all opioid pills, don't hesitate to call our staff using the number provided. We will help make sure you are managing your pain in the best way possible, and if necessary, we can provide a prescription for additional pain medication.   Day 1    Time  Name of Medication Number of pills taken  Amount of Acetaminophen  Pain Level   Comments  AM PM       AM PM         AM PM       AM PM       AM PM       AM PM       AM PM       AM PM       Total Daily amount of Acetaminophen Do not take more than  3,000 mg per day      Day 2    Time  Name of Medication Number of pills taken  Amount of Acetaminophen  Pain Level   Comments  AM PM       AM PM       AM PM       AM PM       AM PM       AM PM       AM PM       AM PM       Total Daily amount of Acetaminophen Do not take more than  3,000 mg per day      Day 3    Time  Name of Medication Number of pills taken  Amount of Acetaminophen  Pain Level   Comments  AM PM       AM PM       AM PM       AM PM          AM PM       AM PM       AM PM       AM PM       Total Daily amount of Acetaminophen Do not take more than  3,000 mg per day      Day 4    Time  Name of Medication Number of pills taken  Amount of Acetaminophen  Pain Level   Comments  AM PM       AM PM       AM PM       AM PM       AM PM       AM PM       AM PM       AM PM       Total Daily amount of Acetaminophen Do not take more than  3,000 mg per day      Day 5    Time  Name of Medication Number of pills taken  Amount of Acetaminophen  Pain Level   Comments  AM  PM       AM PM       AM PM       AM PM       AM PM       AM PM       AM PM       AM PM       Total Daily amount of Acetaminophen Do not take more than  3,000 mg per day       Day 6    Time  Name of Medication Number of pills taken  Amount of Acetaminophen  Pain Level  Comments  AM PM       AM PM       AM PM       AM PM       AM PM       AM PM       AM PM       AM PM       Total Daily amount of Acetaminophen Do not take more than  3,000 mg per day      Day 7    Time  Name of Medication Number of pills taken  Amount of Acetaminophen  Pain Level   Comments  AM PM       AM PM       AM PM       AM PM       AM PM       AM PM       AM PM       AM PM       Total Daily amount of Acetaminophen Do not take  more than  3,000 mg per day        For additional information about how and where to safely dispose of unused opioid medications - [employer]uyhttps://www.morepowerfulnc.org  Disclaimer: This document contains information and/or instructional materials adapted from [facility] for the typical patient with your condition. It does not replace medical advice from your health care provider because your experience may differ from that of the typical patient. Talk to your health care provider if you have any questions about this document, your condition or your treatment plan. Adapted from [facility]

## 2019-11-18 ENCOUNTER — Other Ambulatory Visit: Payer: Self-pay

## 2019-11-18 ENCOUNTER — Emergency Department (HOSPITAL_COMMUNITY)
Admission: EM | Admit: 2019-11-18 | Discharge: 2019-11-18 | Disposition: A | Discharge status: Home / Self Care | Payer: 59 | Attending: Emergency Medicine | Admitting: Emergency Medicine

## 2019-11-18 ENCOUNTER — Emergency Department (HOSPITAL_COMMUNITY): Payer: 59

## 2019-11-18 DIAGNOSIS — F1210 Cannabis abuse, uncomplicated: Secondary | ICD-10-CM | POA: Insufficient documentation

## 2019-11-18 DIAGNOSIS — R2242 Localized swelling, mass and lump, left lower limb: Secondary | ICD-10-CM | POA: Insufficient documentation

## 2019-11-18 DIAGNOSIS — M25572 Pain in left ankle and joints of left foot: Secondary | ICD-10-CM | POA: Diagnosis not present

## 2019-11-18 NOTE — ED Provider Notes (Signed)
[facility] EMERGENCY DEPARTMENT Provider Note   CSN: [id number] Arrival date & time: 11/18/19  1226     History Chief Complaint  Patient presents with  . Ankle Pain    Curtis Holland is a 29 y.o. male presents for evaluation of acute onset, progressively worsening left ankle pain for 5 days.  Reports that on Thursday while he was walking he heard someone call his name, planted his left foot on the ground and turned, accidentally inverting his left ankle.  He reports feeling immediate onset of pain to the lateral aspect of the left ankle which occasionally radiates down to the plantar aspect of the foot and up to the mid lower leg at times.  Denies numbness or tingling.  Reports that the symptoms are fairly mild but worsened 2 days ago after walking all day.  He awoke yesterday morning feeling the pain was worse.  He has swelling to the lateral aspect of the left foot.  He has been taking ibuprofen with relief.  Denies fevers.  The history is provided by the patient.       Past Medical History:  Diagnosis Date  . History of ear infections     Patient Active Problem List   Diagnosis Date Noted  . S/P laparoscopic appendectomy 04/03/2019  . DEPRESSIVE DISORDER NOT ELSEWHERE CLASSIFIED 01/29/2008  . HYPERSOMNIA, ASSOCIATED WITH SLEEP APNEA 01/29/2008    Past Surgical History:  Procedure Laterality Date  . APPENDECTOMY  04/03/2019  . LAPAROSCOPIC APPENDECTOMY N/A 04/03/2019   Procedure: APPENDECTOMY LAPAROSCOPIC;  Surgeon: Andria Meuse, MD;  Location: [facility] OR;  Service: General;  Laterality: N/A;  . TYMPANOSTOMY TUBE PLACEMENT         No family history on file.  Social History   Tobacco Use  . Smoking status: Never Smoker  . Smokeless tobacco: Never Used  Substance Use Topics  . Alcohol use: No  . Drug use: Yes    Types: Marijuana    Home Medications Prior to Admission medications   Medication Sig Start Date End Date Taking? Authorizing  Provider  acetaminophen (TYLENOL) 325 MG tablet Take 2 tablets (650 mg total) by mouth every 6 (six) hours. 04/04/19   Barnetta Chapel, PA-C  ibuprofen (ADVIL) 600 MG tablet Take 1 tablet (600 mg total) by mouth every 6 (six) hours as needed (pain not controlled with tylneol). 04/04/19   Barnetta Chapel, PA-C  traMADol (ULTRAM) 50 MG tablet Take 1 tablet (50 mg total) by mouth every 6 (six) hours as needed (pain not controlled with ibuprofen). 04/04/19   Barnetta Chapel, PA-C    Allergies    Patient has no known allergies.  Review of Systems   Review of Systems  Constitutional: Negative for fever.  Musculoskeletal: Positive for arthralgias and joint swelling.  Neurological: Negative for weakness and numbness.    Physical Exam Updated Vital Signs BP (!) 142/85 (BP Location: Right Arm)   Pulse 86   Temp 98.1 F (36.7 C) (Oral)   Resp 16   Ht 5\' 8"  (1.727 m)   Wt 122.5 kg   SpO2 100%   BMI 41.05 kg/m   Physical Exam Vitals and nursing note reviewed.  Constitutional:      General: He is not in acute distress.    Appearance: He is well-developed.  HENT:     Head: Normocephalic and atraumatic.  Eyes:     General:        Right eye: No discharge.  Left eye: No discharge.     Conjunctiva/sclera: Conjunctivae normal.  Neck:     Vascular: No JVD.     Trachea: No tracheal deviation.  Cardiovascular:     Rate and Rhythm: Normal rate.     Pulses: Normal pulses.     Comments: 2+ DP/PT pulses bilaterally, Homans sign absent bilaterally, no palpable cords, compartments are soft  Pulmonary:     Effort: Pulmonary effort is normal.  Abdominal:     General: There is no distension.  Musculoskeletal:        General: Swelling and tenderness present.     Comments: Swelling just inferior to the left lateral malleolus.  Some tenderness to palpation overlying this area.  No erythema or warmth.  Normal active and passive range of motion at the left ankle.  5/5 strength of BLE major muscle  groups.  No crepitus noted.  No ecchymosis.  No ligamentous laxity on examination of the left ankle and no deformity noted to the Achilles tendon.  Skin:    General: Skin is warm and dry.     Capillary Refill: Capillary refill takes less than 2 seconds.     Findings: No erythema.  Neurological:     Mental Status: He is alert.     Comments: Sensation intact to light touch of bilateral lower extremities.  Ambulatory with steady gait and balance, able to heel walk and toe walk without difficulty  Psychiatric:        Behavior: Behavior normal.     ED Results / Procedures / Treatments   Labs (all labs ordered are listed, but only abnormal results are displayed) Labs Reviewed - No data to display  EKG None  Radiology DG Ankle Complete Left  Result Date: 11/18/2019 CLINICAL DATA:  Left ankle pain after injury last week. EXAM: LEFT ANKLE COMPLETE - 3+ VIEW COMPARISON:  None. FINDINGS: There is no evidence of fracture, dislocation, or joint effusion. There is no evidence of arthropathy or other focal bone abnormality. Soft tissues are unremarkable. IMPRESSION: Negative. Electronically Signed   By: Lupita Raider M.D.   On: 11/18/2019 13:18   DG Foot Complete Left  Result Date: 11/18/2019 CLINICAL DATA:  Left foot pain after injury last week. EXAM: LEFT FOOT - COMPLETE 3+ VIEW COMPARISON:  None. FINDINGS: There is no evidence of fracture or dislocation. There is no evidence of arthropathy or other focal bone abnormality. Soft tissues are unremarkable. IMPRESSION: Negative. Electronically Signed   By: Lupita Raider M.D.   On: 11/18/2019 13:17    Procedures Procedures (including critical care time)  Medications Ordered in ED Medications - No data to display  ED Course  I have reviewed the triage vital signs and the nursing notes.  Pertinent labs & imaging results that were available during my care of the patient were reviewed by me and considered in my medical decision making (see chart  for details).    MDM Rules/Calculators/A&P                      Patient presenting for evaluation of left ankle pain secondary to injury a few days ago.  Worsened after walking for a long period of time on Saturday.  He is afebrile, vital signs are stable.  He is nontoxic in appearance.  He is neurovascularly intact and compartments are soft.  No signs of secondary skin infection.  Doubt septic arthritis, gout, osteomyelitis, or DVT.  Radiographs show no evidence of acute osseous  abnormality.  Conservative therapy indicated and discussed with patient including rest, ice, compression, elevation.  Will discharge with ASO ankle brace and crutches.  Recommend follow-up with orthopedist for reevaluation of symptoms if they persist.  Discussed strict ED return precautions Patient verbalized understanding of and agreement with plan and is safe for discharge home at this time.   Final Clinical Impression(s) / ED Diagnoses Final diagnoses:  Acute left ankle pain    Rx / DC Orders ED Discharge Orders    None       Renita Papa, PA-C 11/18/19 1417    Pattricia Boss, MD 11/18/19 1904

## 2019-11-18 NOTE — ED Notes (Signed)
Crutches adjusted to pt's height and provided to pt.

## 2019-11-18 NOTE — Discharge Instructions (Signed)
1. Medications: Alternate 600 mg of ibuprofen and [phone] mg of Tylenol every 3-4 hours as needed for pain. Do not exceed 4000 mg of Tylenol daily.  Take ibuprofen with food to avoid upset stomach issues.  2. Treatment: rest, ice 20 minutes at a time 3-4 times daily, wear ankle brace and elevate when you are not walking on it, use crutches to help you get around, drink plenty of fluids, gentle stretching 3. Follow Up: Please followup with orthopedics as directed or your PCP in 1 week if no improvement for discussion of your diagnoses and further evaluation after today's visit; Please return to the ER for worsening symptoms or other concerns such as worsening swelling, redness of the skin, fevers, loss of pulses, or loss of feeling

## 2019-11-18 NOTE — ED Notes (Signed)
RN applied ankle brace. Pt verbalized understanding of discharge instructions. Pain management and follow up care reviewed.

## 2019-11-18 NOTE — ED Triage Notes (Signed)
Patient presents to the ED via POV with C/O left ankle pain.  Injury occurred Thursday when she turned quickly.  Pain has worsened since the injury to the point of not being able to stand on Sunday.  Today she comes to the ED for evaluation of her left ankle injury.

## 2020-07-03 ENCOUNTER — Encounter (HOSPITAL_COMMUNITY): Payer: Self-pay | Admitting: Emergency Medicine

## 2020-07-03 ENCOUNTER — Ambulatory Visit (HOSPITAL_COMMUNITY)
Admission: EM | Admit: 2020-07-03 | Discharge: 2020-07-03 | Disposition: A | Discharge status: Home / Self Care | Payer: 59 | Attending: Family Medicine | Admitting: Family Medicine

## 2020-07-03 ENCOUNTER — Other Ambulatory Visit: Payer: Self-pay

## 2020-07-03 DIAGNOSIS — M62838 Other muscle spasm: Secondary | ICD-10-CM | POA: Diagnosis not present

## 2020-07-03 MED ORDER — CYCLOBENZAPRINE HCL 5 MG PO TABS: 5.0000 mg | ORAL_TABLET | Freq: Three times a day (TID) | ORAL | 0 refills | Status: DC | PRN

## 2020-07-03 MED ORDER — IBUPROFEN 600 MG PO TABS: 600.0000 mg | ORAL_TABLET | Freq: Three times a day (TID) | ORAL | 0 refills | Status: DC | PRN

## 2020-07-03 NOTE — ED Triage Notes (Signed)
Pt presents with neck, should and arm pain. States woke up yesterday and had ache in neck and today has radiated into shoulder and neck.

## 2020-07-03 NOTE — Discharge Instructions (Signed)
Take the medicine as prescribed for muscle strain/spasm and pain. The Flexeril may make you a little drowsy. Recommend heat to the area and gentle stretching Follow up as needed for continued or worsening symptoms

## 2020-07-06 NOTE — ED Provider Notes (Signed)
[facility]    CSN: [id number] Arrival date & time: 07/03/20  1140      History   Chief Complaint Chief Complaint  Patient presents with  . Shoulder Pain  . Arm Pain    HPI Curtis Holland is a 29 y.o. male.   Patient is a 28-year male presents today with left lateral neck discomfort, upper back and shoulder discomfort.  Woke up this way yesterday.  Describes as sore and achy.  Denies any falls or injuries.  Does have lifting at work.  No numbness, tingling or weakness in extremities.     Past Medical History:  Diagnosis Date  . History of ear infections     Patient Active Problem List   Diagnosis Date Noted  . S/P laparoscopic appendectomy 04/03/2019  . DEPRESSIVE DISORDER NOT ELSEWHERE CLASSIFIED 01/29/2008  . HYPERSOMNIA, ASSOCIATED WITH SLEEP APNEA 01/29/2008    Past Surgical History:  Procedure Laterality Date  . APPENDECTOMY  04/03/2019  . LAPAROSCOPIC APPENDECTOMY N/A 04/03/2019   Procedure: APPENDECTOMY LAPAROSCOPIC;  Surgeon: Andria Meuse, MD;  Location: [facility] OR;  Service: General;  Laterality: N/A;  . TYMPANOSTOMY TUBE PLACEMENT         Home Medications    Prior to Admission medications   Medication Sig Start Date End Date Taking? Authorizing Provider  cyclobenzaprine (FLEXERIL) 5 MG tablet Take 1 tablet (5 mg total) by mouth 3 (three) times daily as needed for muscle spasms. 07/03/20   Dahlia Byes A, NP  ibuprofen (ADVIL) 600 MG tablet Take 1 tablet (600 mg total) by mouth every 8 (eight) hours as needed for moderate pain. 07/03/20   Janace Aris, NP    Family History History reviewed. No pertinent family history.  Social History Social History   Tobacco Use  . Smoking status: Never Smoker  . Smokeless tobacco: Never Used  Substance Use Topics  . Alcohol use: No  . Drug use: Yes    Types: Marijuana     Allergies   Patient has no known allergies.   Review of Systems Review of Systems   Physical Exam Triage  Vital Signs ED Triage Vitals [07/03/20 1334]  Enc Vitals Group     BP (!) 142/85     Pulse Rate 69     Resp 16     Temp 98.7 F (37.1 C)     Temp Source Oral     SpO2 100 %     Weight      Height      Head Circumference      Peak Flow      Pain Score 6     Pain Loc      Pain Edu?      Excl. in GC?    No data found.  Updated Vital Signs BP (!) 142/85 (BP Location: Left Arm)   Pulse 69   Temp 98.7 F (37.1 C) (Oral)   Resp 16   SpO2 100%   Visual Acuity Right Eye Distance:   Left Eye Distance:   Bilateral Distance:    Right Eye Near:   Left Eye Near:    Bilateral Near:     Physical Exam Vitals and nursing note reviewed.  Constitutional:      Appearance: Normal appearance.  HENT:     Head: Normocephalic and atraumatic.     Nose: Nose normal.  Eyes:     Conjunctiva/sclera: Conjunctivae normal.  Neck:   Pulmonary:     Effort: Pulmonary  effort is normal.  Musculoskeletal:        General: Normal range of motion.     Cervical back: Normal range of motion. Muscular tenderness present.  Skin:    General: Skin is warm and dry.  Neurological:     Mental Status: He is alert.  Psychiatric:        Mood and Affect: Mood normal.      UC Treatments / Results  Labs (all labs ordered are listed, but only abnormal results are displayed) Labs Reviewed - No data to display  EKG   Radiology No results found.  Procedures Procedures (including critical care time)  Medications Ordered in UC Medications - No data to display  Initial Impression / Assessment and Plan / UC Course  I have reviewed the triage vital signs and the nursing notes.  Pertinent labs & imaging results that were available during my care of the patient were reviewed by me and considered in my medical decision making (see chart for details).     Trapezius muscle spasm Treating with Flexeril and ibuprofen Recommend heat to the area gentle stretching. Follow up as needed for continued or  worsening symptoms   Final Clinical Impressions(s) / UC Diagnoses   Final diagnoses:  Trapezius muscle spasm     Discharge Instructions     Take the medicine as prescribed for muscle strain/spasm and pain. The Flexeril may make you a little drowsy. Recommend heat to the area and gentle stretching Follow up as needed for continued or worsening symptoms     ED Prescriptions    Medication Sig Dispense Auth. Provider   cyclobenzaprine (FLEXERIL) 5 MG tablet Take 1 tablet (5 mg total) by mouth 3 (three) times daily as needed for muscle spasms. 30 tablet Devlyn Retter A, NP   ibuprofen (ADVIL) 600 MG tablet Take 1 tablet (600 mg total) by mouth every 8 (eight) hours as needed for moderate pain. 30 tablet Dahlia Byes A, NP     PDMP not reviewed this encounter.   Janace Aris, NP 07/06/20 [phone]

## 2020-11-19 ENCOUNTER — Encounter (HOSPITAL_COMMUNITY): Payer: Self-pay

## 2020-11-19 ENCOUNTER — Other Ambulatory Visit: Payer: Self-pay

## 2020-11-19 ENCOUNTER — Ambulatory Visit (HOSPITAL_COMMUNITY)
Admission: EM | Admit: 2020-11-19 | Discharge: 2020-11-19 | Disposition: A | Discharge status: Home / Self Care | Payer: 59 | Attending: Emergency Medicine | Admitting: Emergency Medicine

## 2020-11-19 DIAGNOSIS — M25532 Pain in left wrist: Secondary | ICD-10-CM | POA: Diagnosis not present

## 2020-11-19 MED ORDER — IBUPROFEN 600 MG PO TABS: 600.0000 mg | ORAL_TABLET | Freq: Four times a day (QID) | ORAL | 0 refills | Status: DC | PRN

## 2020-11-19 MED ORDER — CYCLOBENZAPRINE HCL 5 MG PO TABS: 5.0000 mg | ORAL_TABLET | Freq: Two times a day (BID) | ORAL | 0 refills | Status: DC | PRN

## 2020-11-19 NOTE — ED Provider Notes (Signed)
[facility]    CSN: [id number] Arrival date & time: 11/19/20  1207      History   Chief Complaint Chief Complaint  Patient presents with  . Wrist Pain    HPI Curtis Holland is a 30 y.o. male.   Patient presents with worsening left wrist pain 7/10 starting Tuesday. Worsened when rotating the wrist and closing the hand. Denies numbness, tingling, swelling, bruising. 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 Says recently the workload has increased. Has been wearing a brace for two days to help.   Past Medical History:  Diagnosis Date  . History of ear infections     Patient Active Problem List   Diagnosis Date Noted  . S/P laparoscopic appendectomy 04/03/2019  . DEPRESSIVE DISORDER NOT ELSEWHERE CLASSIFIED 01/29/2008  . HYPERSOMNIA, ASSOCIATED WITH SLEEP APNEA 01/29/2008    Past Surgical History:  Procedure Laterality Date  . APPENDECTOMY  04/03/2019  . LAPAROSCOPIC APPENDECTOMY N/A 04/03/2019   Procedure: APPENDECTOMY LAPAROSCOPIC;  Surgeon: Andria Meuse, MD;  Location: [facility];  Service: General;  Laterality: N/A;  . TYMPANOSTOMY TUBE PLACEMENT         Home Medications    Prior to Admission medications   Medication Sig Start Date End Date Taking? Authorizing Provider  cyclobenzaprine (FLEXERIL) 5 MG tablet Take 1 tablet (5 mg total) by mouth 2 (two) times daily as needed for muscle spasms. 11/19/20   White, Elita Boone, NP  ibuprofen (ADVIL) 600 MG tablet Take 1 tablet (600 mg total) by mouth every 6 (six) hours as needed for moderate pain. 11/19/20   Valinda Hoar, NP    Family History History reviewed. No pertinent family history.  Social History Social History   Tobacco Use  . Smoking status: Never Smoker  . Smokeless tobacco: Never Used  Substance Use Topics  . Alcohol use: No  . Drug use: Yes    Types: Marijuana     Allergies   Patient has no known allergies.   Review of Systems Review of Systems  Constitutional: Negative.    Respiratory: Negative.   Cardiovascular: Negative.   Musculoskeletal: Negative.   Skin: Negative.   Neurological: Negative.      Physical Exam Triage Vital Signs ED Triage Vitals [11/19/20 1249]  Enc Vitals Group     BP (!) 156/96     Pulse Rate 65     Resp 19     Temp 98.4 F (36.9 C)     Temp src      SpO2 100 %     Weight      Height      Head Circumference      Peak Flow      Pain Score 7     Pain Loc      Pain Edu?      Excl. in GC?    No data found.  Updated Vital Signs BP (!) 156/96   Pulse 65   Temp 98.4 F (36.9 C)   Resp 19   SpO2 100%   Visual Acuity Right Eye Distance:   Left Eye Distance:   Bilateral Distance:    Right Eye Near:   Left Eye Near:    Bilateral Near:     Physical Exam Constitutional:      Appearance: Normal appearance. He is obese.  HENT:     Head: Normocephalic.  Pulmonary:     Effort: Pulmonary effort is normal.  Musculoskeletal:     Right  wrist: Normal.       Arms:     Cervical back: Normal range of motion.     Comments: Point of tenderness with mild edema above wrist on lower forearm, ROM of intact but elicited pain at point of tenderness giving a shooting sensation   Skin:    General: Skin is warm and dry.  Neurological:     General: No focal deficit present.     Mental Status: He is alert and oriented to person, place, and time. Mental status is at baseline.  Psychiatric:        Mood and Affect: Mood normal.        Behavior: Behavior normal.        Thought Content: Thought content normal.        Judgment: Judgment normal.      UC Treatments / Results  Labs (all labs ordered are listed, but only abnormal results are displayed) Labs Reviewed - No data to display  EKG   Radiology No results found.  Procedures Procedures (including critical care time)  Medications Ordered in UC Medications - No data to display  Initial Impression / Assessment and Plan / UC Course  I have reviewed the triage  vital signs and the nursing notes.  Pertinent labs & imaging results that were available during my care of the patient were reviewed by me and considered in my medical decision making (see chart for details).  Acutel eft wrist pain   1. ibuprofen 600 mg every 6 hours 2. Flexeril 5 mg bid as needed 3. Heat or ice to preference for 15 minute intervals  4.Follow up persistent pain, increased swelling, decreased movement, numbness or tingling       Final Clinical Impressions(s) / UC Diagnoses   Final diagnoses:  Acute pain of left wrist     Discharge Instructions     Can continue to use wrist brace for comfort  Use ibuprofen 600 mg every 6 hours to help with inflammation and pain   Can take muscle relaxer twice as needed for additional comfort   Can use heating pad or ice In 15 minute intervals for additional comfort  Follow up persistent pain, increased swelling, decreased movement, numbness or tingling    ED Prescriptions    Medication Sig Dispense Auth. Provider   cyclobenzaprine (FLEXERIL) 5 MG tablet Take 1 tablet (5 mg total) by mouth 2 (two) times daily as needed for muscle spasms. 14 tablet White, Adrienne R, NP   ibuprofen (ADVIL) 600 MG tablet Take 1 tablet (600 mg total) by mouth every 6 (six) hours as needed for moderate pain. 40 tablet Valinda Hoar, NP     PDMP not reviewed this encounter.   Valinda Hoar, NP 11/19/20 1330

## 2020-11-19 NOTE — Discharge Instructions (Addendum)
Can continue to use wrist brace for comfort  Use ibuprofen 600 mg every 6 hours to help with inflammation and pain   Can take muscle relaxer twice as needed for additional comfort   Can use heating pad or ice In 15 minute intervals for additional comfort  Follow up persistent pain, increased swelling, decreased movement, numbness or tingling

## 2020-11-19 NOTE — ED Triage Notes (Signed)
Pt in with c/o left wrist pain that he first noticed on Tuesday but has worsened over.  Pt states it is hard to move his wrist and lift things  Pt has been wearing wrist brace for relief

## 2021-01-06 IMAGING — CT CT ABDOMEN AND PELVIS WITH CONTRAST
2 of 4 series · 16 of 46 positions shown, 18 images · IV contrast (APPLIED)
Comparison: Abdominal ultrasound dated July 22, 2005.

CLINICAL DATA: Fever with 2 days of periumbilical and right lower
quadrant abdominal pain.

EXAM:
CT ABDOMEN AND PELVIS WITH CONTRAST
TECHNIQUE: Multidetector CT imaging of the abdomen and pelvis was performed
using the standard protocol following bolus administration of
intravenous contrast.
CONTRAST:  100mL OMNIPAQUE IOHEXOL 300 MG/ML  SOLN

[Series 3: abd/ pelvis 5.0 i30f 2 · axial · 0.97mm/px · z∈[+811,+1296]mm · 13 of 107 slices shown, 15 images]
[im 5/107  soft-tissue]
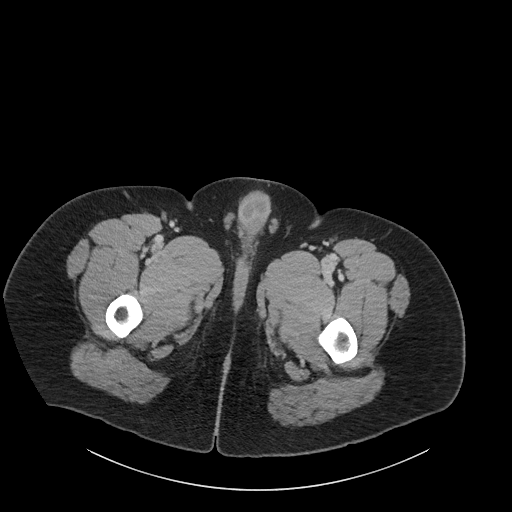
[im 5/107  bone]
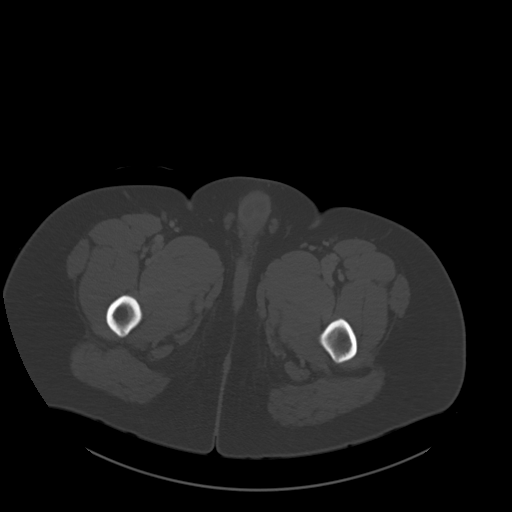
[im 15/107  soft-tissue]
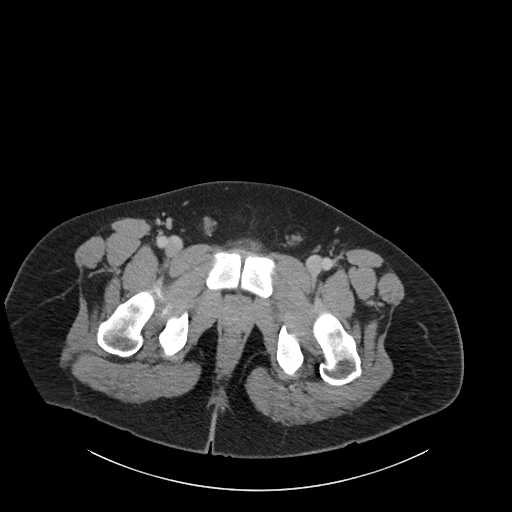
[im 25/107  soft-tissue]
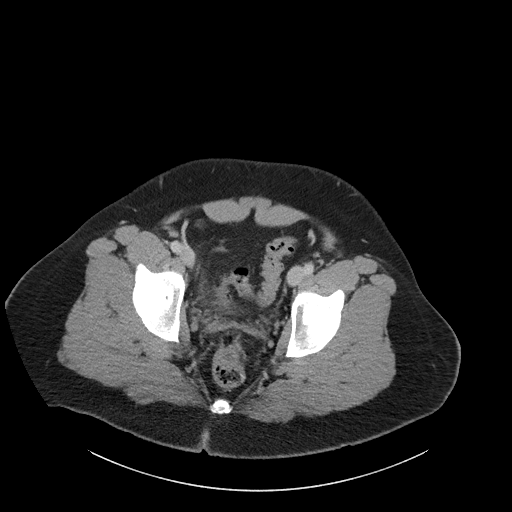
[im 29/107  soft-tissue]
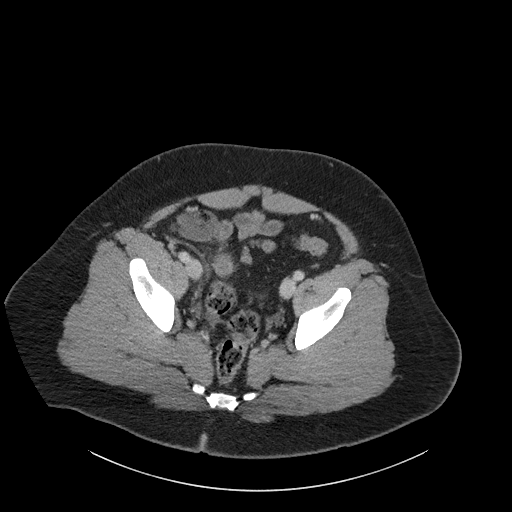
[im 39/107  soft-tissue]
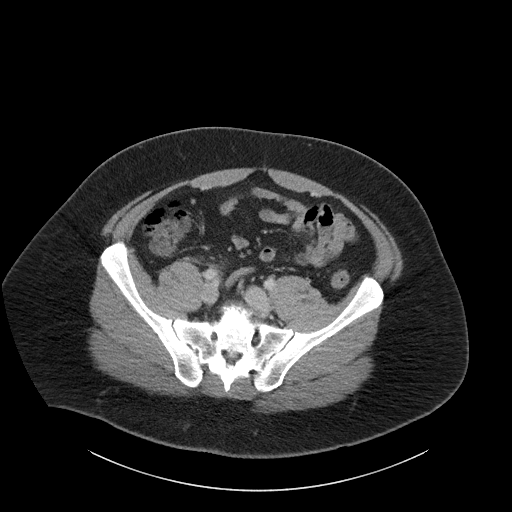
[im 44/107  soft-tissue]
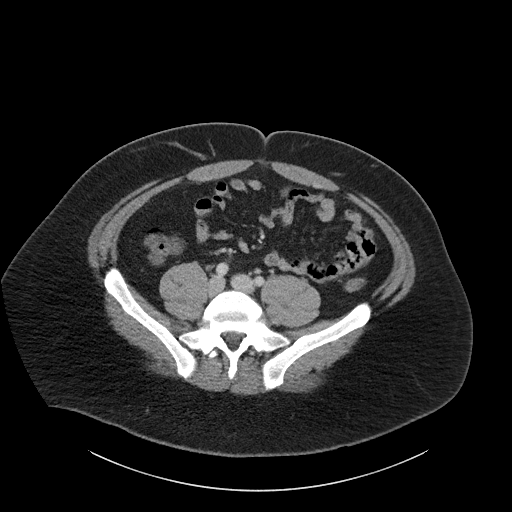
[im 54/107  soft-tissue]
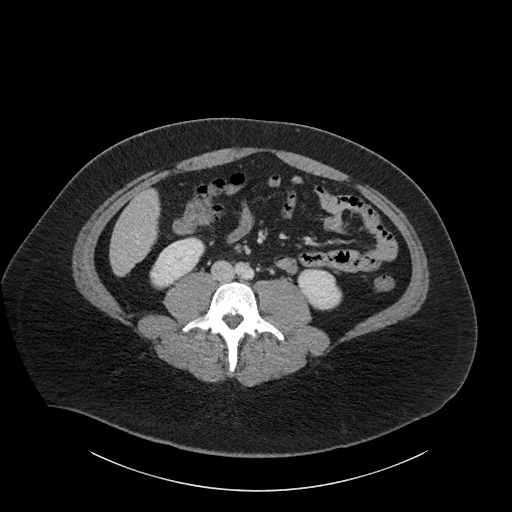
[im 63/107  soft-tissue]
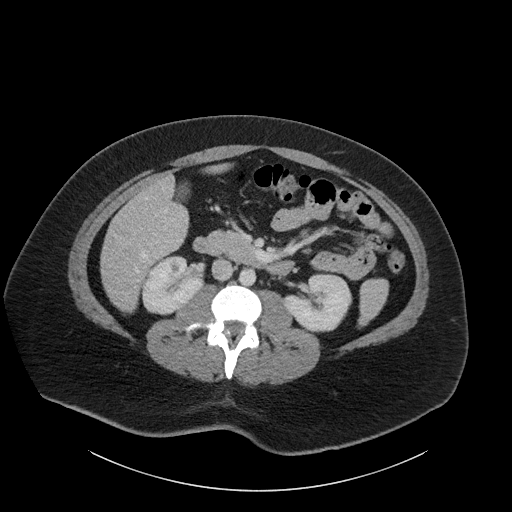
[im 68/107  soft-tissue]
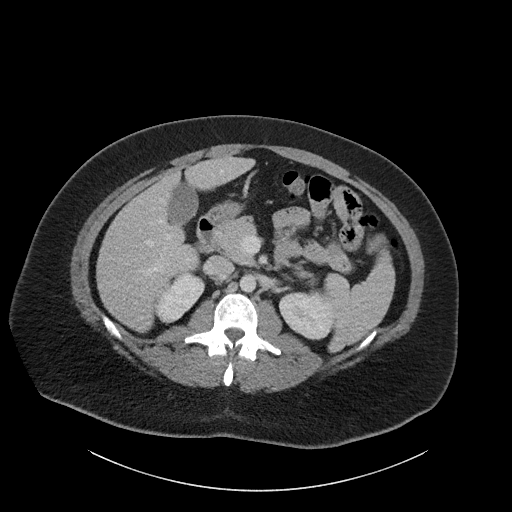
[im 68/107  bone]
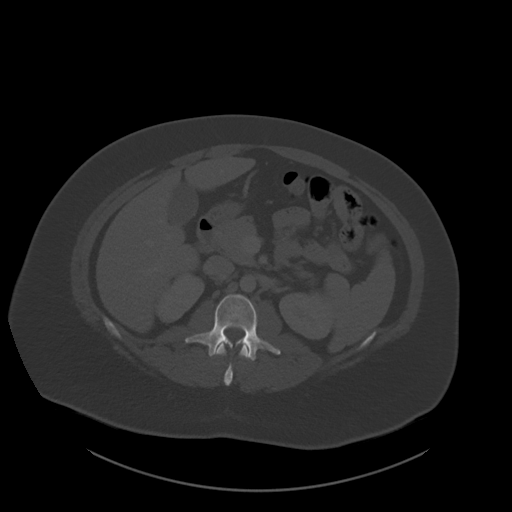
[im 78/107  soft-tissue]
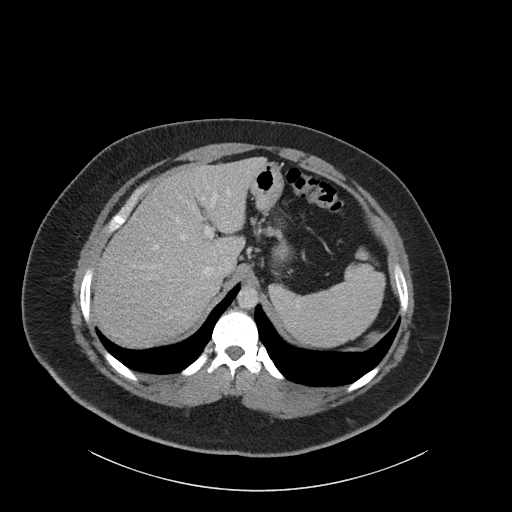
[im 82/107  soft-tissue]
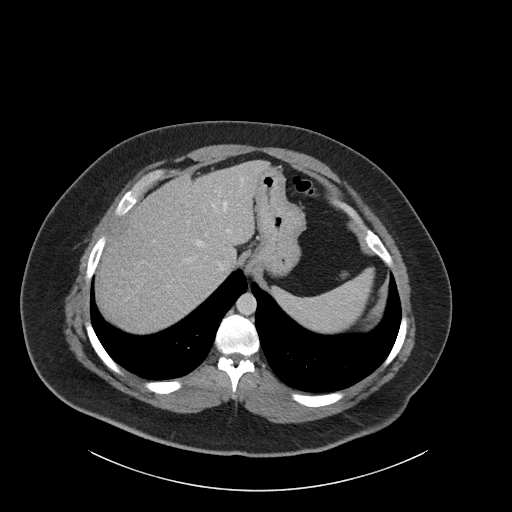
[im 92/107  soft-tissue]
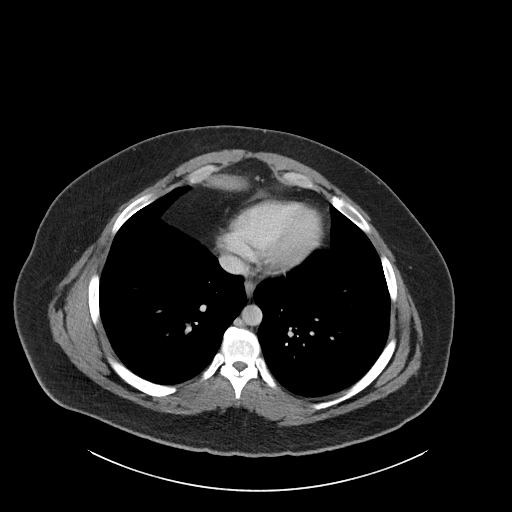
[im 102/107  soft-tissue]
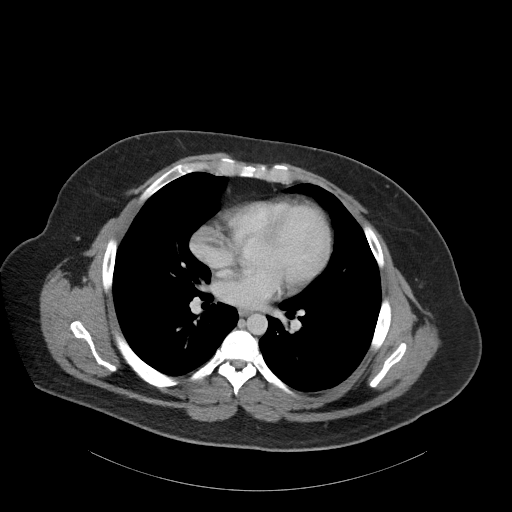

[Series 6: coronal soft tissue · coronal · 0.92mm/px · 3 of 120 slices shown]
[im 40/120  soft-tissue]
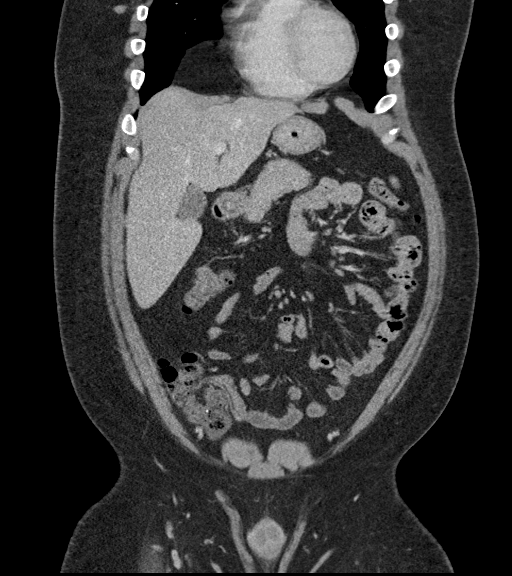
[im 53/120  soft-tissue]
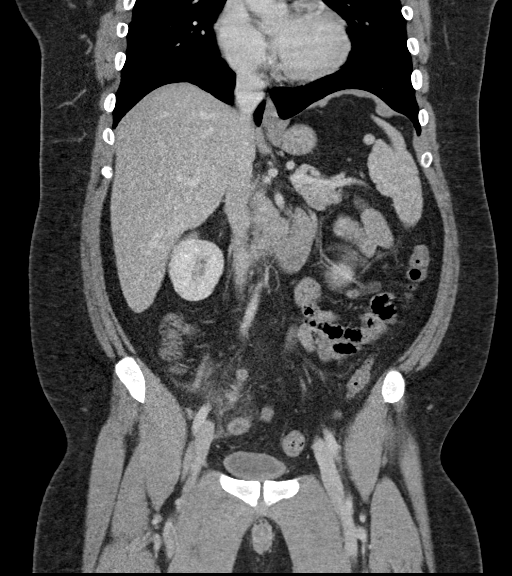
[im 67/120  soft-tissue]
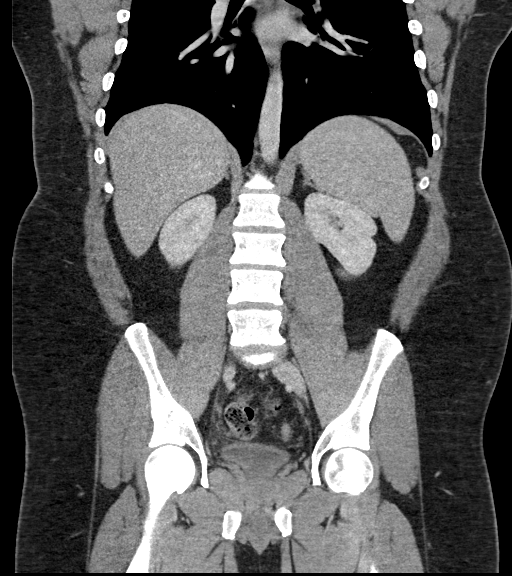

[16 of 46 positions shown; findings below may reference images not displayed]

FINDINGS: Lower chest: No acute abnormality.

Hepatobiliary: No focal liver abnormality is seen. No gallstones,
gallbladder wall thickening, or biliary dilatation.

Pancreas: Unremarkable. No pancreatic ductal dilatation or
surrounding inflammatory changes.

Spleen: Normal in size without focal abnormality.

Adrenals/Urinary Tract: Adrenal glands are unremarkable.
Subcentimeter low-density lesion in the right kidney is too small to
characterize. No renal calculi or hydronephrosis. The bladder is
unremarkable for the degree of distention.

Stomach/Bowel: Dilated distal appendix with mild surrounding
inflammatory changes, consistent with acute appendicitis.

Appendix: Location: Right lower quadrant, posterior to the cecum

Diameter: 12 mm

Appendicolith: None

Mucosal hyper-enhancement: Present

Extraluminal gas: None

Periappendiceal collection: None

The stomach, small bowel, and colon are unremarkable.

Vascular/Lymphatic: No significant vascular findings are present. No
enlarged abdominal or pelvic lymph nodes.

Reproductive: Prostate is unremarkable.

Other: Tiny fat containing left inguinal hernia. No free fluid or
pneumoperitoneum.

Musculoskeletal: No acute or significant osseous findings.
IMPRESSION: 1. Acute appendicitis.  No perforation or abscess.

## 2021-02-03 ENCOUNTER — Encounter (HOSPITAL_COMMUNITY): Payer: Self-pay

## 2021-02-03 ENCOUNTER — Ambulatory Visit (HOSPITAL_COMMUNITY)
Admission: EM | Admit: 2021-02-03 | Discharge: 2021-02-03 | Disposition: A | Discharge status: Home / Self Care | Payer: 59 | Attending: Physician Assistant | Admitting: Physician Assistant

## 2021-02-03 ENCOUNTER — Other Ambulatory Visit: Payer: Self-pay

## 2021-02-03 DIAGNOSIS — J069 Acute upper respiratory infection, unspecified: Secondary | ICD-10-CM | POA: Diagnosis not present

## 2021-02-03 DIAGNOSIS — J029 Acute pharyngitis, unspecified: Secondary | ICD-10-CM | POA: Insufficient documentation

## 2021-02-03 DIAGNOSIS — Z20822 Contact with and (suspected) exposure to covid-19: Secondary | ICD-10-CM | POA: Insufficient documentation

## 2021-02-03 DIAGNOSIS — R059 Cough, unspecified: Secondary | ICD-10-CM | POA: Diagnosis not present

## 2021-02-03 DIAGNOSIS — R52 Pain, unspecified: Secondary | ICD-10-CM | POA: Insufficient documentation

## 2021-02-03 MED ORDER — PROMETHAZINE-DM 6.25-15 MG/5ML PO SYRP: 5.0000 mL | ORAL_SOLUTION | Freq: Two times a day (BID) | ORAL | 0 refills | Status: DC | PRN

## 2021-02-03 MED ORDER — PREDNISONE 20 MG PO TABS: 20.0000 mg | ORAL_TABLET | Freq: Every day | ORAL | 0 refills | Status: AC

## 2021-02-03 NOTE — Discharge Instructions (Addendum)
We will be in touch with your COVID-19 results are positive within 24 to 48 hours.  Please use Promethazine DM for cough symptoms.  You should not drive or drink alcohol with this medication as drowsiness is a common side effect.  I have called in prednisone 20 mg for 4 days to help with your symptoms.  Do not take NSAIDs (aspirin, ibuprofen/Advil, naproxen/Aleve) with this medication as it can cause stomach bleeding.  Use Mucinex and Flonase for symptom relief.  Make sure you are drinking plenty of fluid.  If anything worsens please return for reevaluation.

## 2021-02-03 NOTE — ED Triage Notes (Signed)
Pt in with c/o body aches, ST, subjective fever and fatigue x 5 days  Pt has not taken medication for sx

## 2021-02-03 NOTE — ED Provider Notes (Signed)
[facility]    CSN: [id number] Arrival date & time: 02/03/21  1312      History   Chief Complaint Chief Complaint  Patient presents with  . Sore Throat  . Generalized Body Aches  . Fatigue    HPI Curtis Holland is a 30 y.o. male.   Patient presents today with a 5-day history of subjective fever.  Reports associated cough, sore throat, body aches, fatigue, headache.  Denies any chest pain, shortness of breath, dizziness, syncope.  He has not tried any over-the-counter medications for symptom management.  Denies any known sick contacts.  Denies history of allergies or asthma.  He has not had influenza or COVID-19 vaccination.  Denies any recent antibiotic use.  He denies history of smoking.  He has been able to perform daily activities despite symptoms but is requesting work excuse note today.     Past Medical History:  Diagnosis Date  . History of ear infections     Patient Active Problem List   Diagnosis Date Noted  . S/P laparoscopic appendectomy 04/03/2019  . DEPRESSIVE DISORDER NOT ELSEWHERE CLASSIFIED 01/29/2008  . HYPERSOMNIA, ASSOCIATED WITH SLEEP APNEA 01/29/2008    Past Surgical History:  Procedure Laterality Date  . APPENDECTOMY  04/03/2019  . LAPAROSCOPIC APPENDECTOMY N/A 04/03/2019   Procedure: APPENDECTOMY LAPAROSCOPIC;  Surgeon: Andria Meuse, MD;  Location: [facility] OR;  Service: General;  Laterality: N/A;  . TYMPANOSTOMY TUBE PLACEMENT         Home Medications    Prior to Admission medications   Medication Sig Start Date End Date Taking? Authorizing Provider  predniSONE (DELTASONE) 20 MG tablet Take 1 tablet (20 mg total) by mouth daily with breakfast for 4 days. 02/03/21 02/07/21 Yes Keeshia Sanderlin K, PA-C  promethazine-dextromethorphan (PROMETHAZINE-DM) 6.25-15 MG/5ML syrup Take 5 mLs by mouth 2 (two) times daily as needed for cough. 02/03/21  Yes Aamirah Salmi K, PA-C  cyclobenzaprine (FLEXERIL) 5 MG tablet Take 1 tablet (5 mg total) by  mouth 2 (two) times daily as needed for muscle spasms. 11/19/20   White, Elita Boone, NP  ibuprofen (ADVIL) 600 MG tablet Take 1 tablet (600 mg total) by mouth every 6 (six) hours as needed for moderate pain. 11/19/20   Valinda Hoar, NP    Family History History reviewed. No pertinent family history.  Social History Social History   Tobacco Use  . Smoking status: Never Smoker  . Smokeless tobacco: Never Used  Substance Use Topics  . Alcohol use: No  . Drug use: Yes    Types: Marijuana     Allergies   Patient has no known allergies.   Review of Systems Review of Systems  Constitutional: Positive for fatigue and fever. Negative for activity change and appetite change.  HENT: Positive for congestion and sore throat. Negative for sinus pressure and sneezing.   Respiratory: Positive for cough. Negative for shortness of breath.   Cardiovascular: Negative for chest pain.  Gastrointestinal: Positive for nausea. Negative for abdominal pain, diarrhea and vomiting.  Musculoskeletal: Positive for arthralgias and myalgias.  Neurological: Positive for headaches. Negative for dizziness and light-headedness.     Physical Exam Triage Vital Signs ED Triage Vitals  Enc Vitals Group     BP 02/03/21 1356 (!) 143/90     Pulse Rate 02/03/21 1356 88     Resp 02/03/21 1356 17     Temp 02/03/21 1356 99.5 F (37.5 C)     Temp Source 02/03/21 1356 Oral  SpO2 02/03/21 1356 100 %     Weight --      Height --      Head Circumference --      Peak Flow --      Pain Score 02/03/21 1355 2     Pain Loc --      Pain Edu? --      Excl. in GC? --    No data found.  Updated Vital Signs BP (!) 143/90 (BP Location: Right Arm)   Pulse 88   Temp 99.5 F (37.5 C) (Oral)   Resp 17   SpO2 100%   Visual Acuity Right Eye Distance:   Left Eye Distance:   Bilateral Distance:    Right Eye Near:   Left Eye Near:    Bilateral Near:     Physical Exam Vitals reviewed.  Constitutional:       General: He is awake.     Appearance: Normal appearance. He is normal weight. He is not ill-appearing.     Comments: Very pleasant male appears stated age in no acute distress  HENT:     Head: Normocephalic and atraumatic.     Right Ear: Tympanic membrane, ear canal and external ear normal. Tympanic membrane is not erythematous or bulging.     Left Ear: Tympanic membrane, ear canal and external ear normal. There is impacted cerumen. Tympanic membrane is not erythematous or bulging.     Nose: Congestion present.     Right Sinus: No maxillary sinus tenderness or frontal sinus tenderness.     Left Sinus: No maxillary sinus tenderness or frontal sinus tenderness.     Mouth/Throat:     Pharynx: Uvula midline. Posterior oropharyngeal erythema present. No oropharyngeal exudate.     Comments: Moderate erythema and drainage in posterior oropharynx Cardiovascular:     Rate and Rhythm: Normal rate and regular rhythm.     Heart sounds: No murmur heard.   Pulmonary:     Effort: Pulmonary effort is normal. No accessory muscle usage or respiratory distress.     Breath sounds: Normal breath sounds. No stridor. No wheezing, rhonchi or rales.     Comments: Clear to auscultation bilaterally Abdominal:     General: Bowel sounds are normal.     Palpations: Abdomen is soft.     Tenderness: There is no abdominal tenderness.  Lymphadenopathy:     Head:     Right side of head: No submental, submandibular or tonsillar adenopathy.     Left side of head: No submental, submandibular or tonsillar adenopathy.     Cervical: No cervical adenopathy.  Neurological:     Mental Status: He is alert.  Psychiatric:        Behavior: Behavior is cooperative.      UC Treatments / Results  Labs (all labs ordered are listed, but only abnormal results are displayed) Labs Reviewed  SARS CORONAVIRUS 2 (TAT 6-24 HRS)    EKG   Radiology No results found.  Procedures Procedures (including critical care  time)  Medications Ordered in UC Medications - No data to display  Initial Impression / Assessment and Plan / UC Course  I have reviewed the triage vital signs and the nursing notes.  Pertinent labs & imaging results that were available during my care of the patient were reviewed by me and considered in my medical decision making (see chart for details).     Suspect viral etiology given short duration of symptoms.  Unfortunately, we do not have  flu tests available but this would not change management as patient has been symptomatic for 5 days.  COVID-19 testing was obtained-results pending.  He was prescribed Promethazine DM with instruction not to drive or drink alcohol with this medication as drowsiness is a common side effect.  He was prescribed prednisone burst (20 mg for 4 days) with instruction not to take NSAIDs with this medication due to risk of GI bleeding.  Encouraged him to use over-the-counter medications including Mucinex and Flonase.  Discussed the importance of drinking plenty of fluid.  He was provided work excuse note.  Strict return precautions given to which patient expressed understanding.  Final Clinical Impressions(s) / UC Diagnoses   Final diagnoses:  Upper respiratory tract infection, unspecified type  Cough  Body aches     Discharge Instructions     We will be in touch with your COVID-19 results are positive within 24 to 48 hours.  Please use Promethazine DM for cough symptoms.  You should not drive or drink alcohol with this medication as drowsiness is a common side effect.  I have called in prednisone 20 mg for 4 days to help with your symptoms.  Do not take NSAIDs (aspirin, ibuprofen/Advil, naproxen/Aleve) with this medication as it can cause stomach bleeding.  Use Mucinex and Flonase for symptom relief.  Make sure you are drinking plenty of fluid.  If anything worsens please return for reevaluation.    ED Prescriptions    Medication Sig Dispense Auth.  Provider   promethazine-dextromethorphan (PROMETHAZINE-DM) 6.25-15 MG/5ML syrup Take 5 mLs by mouth 2 (two) times daily as needed for cough. 118 mL Kyros Salzwedel K, PA-C   predniSONE (DELTASONE) 20 MG tablet Take 1 tablet (20 mg total) by mouth daily with breakfast for 4 days. 4 tablet Cherrelle Plante, Noberto Retort, PA-C     PDMP not reviewed this encounter.   Jeani Hawking, PA-C 02/03/21 1415

## 2021-02-04 LAB — SARS CORONAVIRUS 2 (TAT 6-24 HRS): SARS Coronavirus 2: NEGATIVE

## 2021-08-24 ENCOUNTER — Encounter (HOSPITAL_COMMUNITY): Payer: Self-pay

## 2021-08-24 ENCOUNTER — Other Ambulatory Visit: Payer: Self-pay

## 2021-08-24 ENCOUNTER — Ambulatory Visit (HOSPITAL_COMMUNITY)
Admission: RE | Admit: 2021-08-24 | Discharge: 2021-08-24 | Disposition: A | Discharge status: Home / Self Care | Payer: 59 | Source: Ambulatory Visit | Attending: Physician Assistant | Admitting: Physician Assistant

## 2021-08-24 VITALS — BP 133/81 | HR 93 | Temp 98.7°F | Resp 17

## 2021-08-24 DIAGNOSIS — S91331A Puncture wound without foreign body, right foot, initial encounter: Secondary | ICD-10-CM

## 2021-08-24 MED ORDER — CIPROFLOXACIN HCL 500 MG PO TABS: 500.0000 mg | ORAL_TABLET | Freq: Two times a day (BID) | ORAL | 0 refills | Status: DC

## 2021-08-24 NOTE — ED Provider Notes (Signed)
[facility]    CSN: [id number] Arrival date & time: 08/24/21  1043      History   Chief Complaint Chief Complaint  Patient presents with   Appointment   Foot Injury    HPI Curtis Holland is a 30 y.o. male.   Patient presents today for evaluation of puncture wound that occurred yesterday (08/23/2021).  Reports that he was walking around his yard when a rusty nail went through the bottom of his foot flop and into his right plantar foot.  Initially this blood for a few minutes but has not had any bleeding or draining since that time.  He is confident that there is no retained foreign body and was able to remove the entire nail without difficulty.  He has cleaned this area but not applied any medication.  He is concerned about his tetanus; this was last updated 05/09/2018 so he is not yet due.  Denies any recent antibiotic use.  Denies any history of diabetes.  Denies any additional symptoms including significant pain, redness, swelling, bleeding, drainage, fever, nausea, vomiting.   Past Medical History:  Diagnosis Date   History of ear infections     Patient Active Problem List   Diagnosis Date Noted   S/P laparoscopic appendectomy 04/03/2019   DEPRESSIVE DISORDER NOT ELSEWHERE CLASSIFIED 01/29/2008   HYPERSOMNIA, ASSOCIATED WITH SLEEP APNEA 01/29/2008    Past Surgical History:  Procedure Laterality Date   APPENDECTOMY  04/03/2019   LAPAROSCOPIC APPENDECTOMY N/A 04/03/2019   Procedure: APPENDECTOMY LAPAROSCOPIC;  Surgeon: Andria Meuse, MD;  Location: [facility];  Service: General;  Laterality: N/A;   TYMPANOSTOMY TUBE PLACEMENT         Home Medications    Prior to Admission medications   Medication Sig Start Date End Date Taking? Authorizing Provider  ciprofloxacin (CIPRO) 500 MG tablet Take 1 tablet (500 mg total) by mouth every 12 (twelve) hours. 08/24/21  Yes Sammi Stolarz K, PA-C  cyclobenzaprine (FLEXERIL) 5 MG tablet Take 1 tablet (5 mg total) by  mouth 2 (two) times daily as needed for muscle spasms. 11/19/20   White, Elita Boone, NP  ibuprofen (ADVIL) 600 MG tablet Take 1 tablet (600 mg total) by mouth every 6 (six) hours as needed for moderate pain. 11/19/20   White, Elita Boone, NP  promethazine-dextromethorphan (PROMETHAZINE-DM) 6.25-15 MG/5ML syrup Take 5 mLs by mouth 2 (two) times daily as needed for cough. 02/03/21   Arta Stump, Noberto Retort, PA-C    Family History History reviewed. No pertinent family history.  Social History Social History   Tobacco Use   Smoking status: Never   Smokeless tobacco: Never  Substance Use Topics   Alcohol use: No   Drug use: Yes    Types: Marijuana     Allergies   Patient has no known allergies.   Review of Systems Review of Systems  Constitutional:  Negative for activity change, appetite change, fatigue and fever.  Respiratory:  Negative for cough and shortness of breath.   Cardiovascular:  Negative for chest pain.  Gastrointestinal:  Negative for abdominal pain, diarrhea, nausea and vomiting.  Musculoskeletal:  Negative for arthralgias, gait problem and myalgias.  Skin:  Positive for wound.  Neurological:  Negative for dizziness, light-headedness and headaches.    Physical Exam Triage Vital Signs ED Triage Vitals [08/24/21 1118]  Enc Vitals Group     BP 133/81     Pulse Rate 93     Resp 17     Temp 98.7  F (37.1 C)     Temp Source Oral     SpO2 98 %     Weight      Height      Head Circumference      Peak Flow      Pain Score 0     Pain Loc      Pain Edu?      Excl. in GC?    No data found.  Updated Vital Signs BP 133/81    Pulse 93    Temp 98.7 F (37.1 C) (Oral)    Resp 17    SpO2 98%   Visual Acuity Right Eye Distance:   Left Eye Distance:   Bilateral Distance:    Right Eye Near:   Left Eye Near:    Bilateral Near:     Physical Exam Vitals reviewed.  Constitutional:      General: He is awake.     Appearance: Normal appearance. He is well-developed. He is  not ill-appearing.     Comments: Very pleasant male appears stated age no acute distress sitting comfortably on exam room table  HENT:     Head: Normocephalic and atraumatic.  Cardiovascular:     Rate and Rhythm: Normal rate and regular rhythm.     Pulses:          Posterior tibial pulses are 2+ on the right side and 2+ on the left side.     Heart sounds: Normal heart sounds, S1 normal and S2 normal. No murmur heard. Pulmonary:     Effort: Pulmonary effort is normal.     Breath sounds: Normal breath sounds. No stridor. No wheezing, rhonchi or rales.     Comments: Clear to auscultation bilaterally Musculoskeletal:     Right foot: Normal range of motion. No deformity.       Feet:  Feet:     Right foot:     Protective Sensation: 10 sites tested.  10 sites sensed.     Skin integrity: No ulcer, blister, skin breakdown or erythema.     Toenail Condition: Right toenails are normal.     Comments: 0.5 cm puncture wound noted plantar surface of right foot.  No bleeding or drainage noted.  Foot neurovascularly intact.  No streaking or evidence of lymphangitis. Neurological:     Mental Status: He is alert.  Psychiatric:        Behavior: Behavior is cooperative.     UC Treatments / Results  Labs (all labs ordered are listed, but only abnormal results are displayed) Labs Reviewed - No data to display  EKG   Radiology No results found.  Procedures Procedures (including critical care time)  Medications Ordered in UC Medications - No data to display  Initial Impression / Assessment and Plan / UC Course  I have reviewed the triage vital signs and the nursing notes.  Pertinent labs & imaging results that were available during my care of the patient were reviewed by me and considered in my medical decision making (see chart for details).     No indication for plain films as patient is weightbearing without difficulty and is confident that there is no retained foreign body.  Tetanus  was updated 05/09/2018 and so this does not need to be updated at this time since it is within 5 years.  Given nail went through the rubber surface of his flip-flop and into his foot will cover for Pseudomonas with ciprofloxacin 500 mg twice daily for 5 days.  He was encouraged to keep area clean and dry.  Discussed alarm symptoms that warrant emergent evaluation including signs of infection.  Strict return precautions given to which he expressed understanding.  Final Clinical Impressions(s) / UC Diagnoses   Final diagnoses:  Puncture wound of right foot, initial encounter     Discharge Instructions      Your tetanus is up-to-date and was last given on 05/09/2018 so we did not repeat this today.  Because of how your injury occurred we are going to start you on ciprofloxacin twice daily for 5 days to make sure you do not get an infection.  This can upset your stomach so take it with food.  If you have any significant pain in any of your joints stop medication to be seen immediately.  If you develop any symptoms of infection please return for reevaluation.     ED Prescriptions     Medication Sig Dispense Auth. Provider   ciprofloxacin (CIPRO) 500 MG tablet Take 1 tablet (500 mg total) by mouth every 12 (twelve) hours. 10 tablet Roxanne Panek, Noberto Retort, PA-C      PDMP not reviewed this encounter.   Jeani Hawking, PA-C 08/24/21 1138

## 2021-08-24 NOTE — Discharge Instructions (Signed)
Your tetanus is up-to-date and was last given on 05/09/2018 so we did not repeat this today.  Because of how your injury occurred we are going to start you on ciprofloxacin twice daily for 5 days to make sure you do not get an infection.  This can upset your stomach so take it with food.  If you have any significant pain in any of your joints stop medication to be seen immediately.  If you develop any symptoms of infection please return for reevaluation.

## 2021-08-24 NOTE — ED Triage Notes (Signed)
Pt reports stepping on a rusty nails yesterday. States he wants a tetanus shot. Pt denies pain.

## 2021-09-28 ENCOUNTER — Ambulatory Visit (HOSPITAL_COMMUNITY)
Admission: EM | Admit: 2021-09-28 | Discharge: 2021-09-28 | Disposition: A | Discharge status: Home / Self Care | Payer: 59 | Attending: Family Medicine | Admitting: Family Medicine

## 2021-09-28 ENCOUNTER — Encounter (HOSPITAL_COMMUNITY): Payer: Self-pay

## 2021-09-28 ENCOUNTER — Other Ambulatory Visit: Payer: Self-pay

## 2021-09-28 DIAGNOSIS — M542 Cervicalgia: Secondary | ICD-10-CM

## 2021-09-28 MED ORDER — IBUPROFEN 800 MG PO TABS: 800.0000 mg | ORAL_TABLET | Freq: Three times a day (TID) | ORAL | 0 refills | Status: DC | PRN

## 2021-09-28 MED ORDER — CYCLOBENZAPRINE HCL 10 MG PO TABS: 10.0000 mg | ORAL_TABLET | Freq: Three times a day (TID) | ORAL | 0 refills | Status: DC | PRN

## 2021-09-28 NOTE — ED Provider Notes (Signed)
[facility]    CSN: [id number] Arrival date & time: 09/28/21  1136      History   Chief Complaint Chief Complaint  Patient presents with   Neck Pain    HPI Curtis Holland is a 31 y.o. male.    Neck Pain Here with a 3-week history of pain in his left neck and shoulder.  No fall or trauma.  He did notice a crick in his left neck about 3 weeks ago and then it worsened.  He has had trouble turning his head and looking up.  The pain does radiate into his left upper arm no numbness or tingling is noted.  Past Medical History:  Diagnosis Date   History of ear infections     Patient Active Problem List   Diagnosis Date Noted   S/P laparoscopic appendectomy 04/03/2019   DEPRESSIVE DISORDER NOT ELSEWHERE CLASSIFIED 01/29/2008   HYPERSOMNIA, ASSOCIATED WITH SLEEP APNEA 01/29/2008    Past Surgical History:  Procedure Laterality Date   APPENDECTOMY  04/03/2019   LAPAROSCOPIC APPENDECTOMY N/A 04/03/2019   Procedure: APPENDECTOMY LAPAROSCOPIC;  Surgeon: Andria Meuse, MD;  Location: [facility];  Service: General;  Laterality: N/A;   TYMPANOSTOMY TUBE PLACEMENT         Home Medications    Prior to Admission medications   Medication Sig Start Date End Date Taking? Authorizing Provider  cyclobenzaprine (FLEXERIL) 10 MG tablet Take 1 tablet (10 mg total) by mouth 3 (three) times daily as needed for muscle spasms. 09/28/21  Yes Zenia Resides, MD  ibuprofen (ADVIL) 800 MG tablet Take 1 tablet (800 mg total) by mouth every 8 (eight) hours as needed (pain). 09/28/21  Yes Zenia Resides, MD    Family History History reviewed. No pertinent family history.  Social History Social History   Tobacco Use   Smoking status: Never   Smokeless tobacco: Never  Substance Use Topics   Alcohol use: No   Drug use: Yes    Types: Marijuana     Allergies   Patient has no known allergies.   Review of Systems Review of Systems  Musculoskeletal:  Positive for neck  pain.    Physical Exam Triage Vital Signs ED Triage Vitals  Enc Vitals Group     BP 09/28/21 1148 133/86     Pulse Rate 09/28/21 1148 72     Resp 09/28/21 1148 14     Temp 09/28/21 1148 98.3 F (36.8 C)     Temp Source 09/28/21 1148 Oral     SpO2 09/28/21 1148 98 %     Weight --      Height --      Head Circumference --      Peak Flow --      Pain Score 09/28/21 1151 5     Pain Loc --      Pain Edu? --      Excl. in GC? --    No data found.  Updated Vital Signs BP 133/86 (BP Location: Left Arm)    Pulse 72    Temp 98.3 F (36.8 C) (Oral)    Resp 14    SpO2 98%   Visual Acuity Right Eye Distance:   Left Eye Distance:   Bilateral Distance:    Right Eye Near:   Left Eye Near:    Bilateral Near:     Physical Exam Vitals reviewed.  Constitutional:      General: He is not in acute distress.  Appearance: He is not toxic-appearing.  HENT:     Mouth/Throat:     Mouth: Mucous membranes are moist.  Eyes:     Extraocular Movements: Extraocular movements intact.     Pupils: Pupils are equal, round, and reactive to light.  Neck:     Comments: Tender over left trap Cardiovascular:     Rate and Rhythm: Normal rate and regular rhythm.     Heart sounds: No murmur heard. Pulmonary:     Effort: Pulmonary effort is normal.     Breath sounds: Normal breath sounds.  Skin:    Capillary Refill: Capillary refill takes less than 2 seconds.     Coloration: Skin is not pale.     Findings: No rash.  Neurological:     General: No focal deficit present.     Mental Status: He is alert and oriented to person, place, and time.  Psychiatric:        Behavior: Behavior normal.     UC Treatments / Results  Labs (all labs ordered are listed, but only abnormal results are displayed) Labs Reviewed - No data to display  EKG   Radiology No results found.  Procedures Procedures (including critical care time)  Medications Ordered in UC Medications - No data to  display  Initial Impression / Assessment and Plan / UC Course  I have reviewed the triage vital signs and the nursing notes.  Pertinent labs & imaging results that were available during my care of the patient were reviewed by me and considered in my medical decision making (see chart for details).     Will treat for muscle spasm. Assistance requested to find a PCP Final Clinical Impressions(s) / UC Diagnoses   Final diagnoses:  Neck pain     Discharge Instructions      Take ibuprofen 800 mg 1 every 8 hours as needed from pain  Take cyclobenzaprine 1 every 8 hours as needed for muscle spasm or muscle pain.  This medication can make you sleepy.   Use a heating pad to the sore areas     ED Prescriptions     Medication Sig Dispense Auth. Provider   ibuprofen (ADVIL) 800 MG tablet Take 1 tablet (800 mg total) by mouth every 8 (eight) hours as needed (pain). 30 tablet Quanita Barona, Janace Aris, MD   cyclobenzaprine (FLEXERIL) 10 MG tablet Take 1 tablet (10 mg total) by mouth 3 (three) times daily as needed for muscle spasms. 20 tablet Octavius Shin, Janace Aris, MD      I have reviewed the PDMP during this encounter.   Zenia Resides, MD 09/28/21 1227

## 2021-09-28 NOTE — Discharge Instructions (Addendum)
Take ibuprofen 800 mg 1 every 8 hours as needed from pain  Take cyclobenzaprine 1 every 8 hours as needed for muscle spasm or muscle pain.  This medication can make you sleepy.   Use a heating pad to the sore areas

## 2021-09-28 NOTE — ED Triage Notes (Signed)
Pt presents with neck pain x3 weeks.

## 2021-10-04 ENCOUNTER — Ambulatory Visit
Admission: RE | Admit: 2021-10-04 | Discharge: 2021-10-04 | Disposition: A | Discharge status: Home / Self Care | Payer: 59 | Source: Ambulatory Visit

## 2021-10-04 ENCOUNTER — Other Ambulatory Visit: Payer: Self-pay

## 2021-10-04 VITALS — BP 149/88 | HR 97 | Temp 98.3°F | Resp 18

## 2021-10-04 DIAGNOSIS — M62838 Other muscle spasm: Secondary | ICD-10-CM

## 2021-10-04 NOTE — Discharge Instructions (Addendum)
Continue the ibuprofen and cyclobenzaprine as prescribed.  Follow up with your primary care provider or an orthopedist if your symptoms are not improving.

## 2021-10-04 NOTE — ED Provider Notes (Signed)
[facility]    CSN: [id number] Arrival date & time: 10/04/21  1340      History   Chief Complaint Chief Complaint  Patient presents with   Neck Pain   Shoulder Pain    HPI Curtis Holland is a 31 y.o. male.  Patient presents with left side neck and shoulder pain x3 weeks; worse since last night.  He was seen at [facility] urgent care on 09/28/2021; diagnosed with neck pain; treated with ibuprofen and cyclobenzaprine.  The medications are helping but he twisted to the left side last night and the pain became worse.  He heard a pop in his shoulder when he twisted. No numbness, weakness, paresthesias, redness, bruising, wounds, fever, or other symptoms.    The history is provided by the patient and medical records.   Past Medical History:  Diagnosis Date   History of ear infections     Patient Active Problem List   Diagnosis Date Noted   S/P laparoscopic appendectomy 04/03/2019   DEPRESSIVE DISORDER NOT ELSEWHERE CLASSIFIED 01/29/2008   HYPERSOMNIA, ASSOCIATED WITH SLEEP APNEA 01/29/2008    Past Surgical History:  Procedure Laterality Date   APPENDECTOMY  04/03/2019   LAPAROSCOPIC APPENDECTOMY N/A 04/03/2019   Procedure: APPENDECTOMY LAPAROSCOPIC;  Surgeon: Andria Meuse, MD;  Location: [facility] OR;  Service: General;  Laterality: N/A;   TYMPANOSTOMY TUBE PLACEMENT         Home Medications    Prior to Admission medications   Medication Sig Start Date End Date Taking? Authorizing Provider  cyclobenzaprine (FLEXERIL) 10 MG tablet Take 1 tablet (10 mg total) by mouth 3 (three) times daily as needed for muscle spasms. 09/28/21  Yes Zenia Resides, MD  ibuprofen (ADVIL) 800 MG tablet Take 1 tablet (800 mg total) by mouth every 8 (eight) hours as needed (pain). 09/28/21  Yes Zenia Resides, MD    Family History History reviewed. No pertinent family history.  Social History Social History   Tobacco Use   Smoking status: Never   Smokeless tobacco: Never   Vaping Use   Vaping Use: Never used  Substance Use Topics   Alcohol use: No   Drug use: Yes    Types: Marijuana     Allergies   Patient has no known allergies.   Review of Systems Review of Systems  Constitutional:  Negative for chills and fever.  Respiratory:  Negative for cough and shortness of breath.   Cardiovascular:  Negative for chest pain and palpitations.  Musculoskeletal:  Positive for arthralgias, myalgias, neck pain and neck stiffness. Negative for back pain and joint swelling.  Skin:  Negative for color change, rash and wound.  Neurological:  Negative for weakness and numbness.  All other systems reviewed and are negative.   Physical Exam Triage Vital Signs ED Triage Vitals  Enc Vitals Group     BP 10/04/21 1351 (!) 149/88     Pulse Rate 10/04/21 1351 97     Resp 10/04/21 1351 18     Temp 10/04/21 1351 98.3 F (36.8 C)     Temp Source 10/04/21 1351 Oral     SpO2 10/04/21 1351 98 %     Weight --      Height --      Head Circumference --      Peak Flow --      Pain Score 10/04/21 1354 2     Pain Loc --      Pain Edu? --  Excl. in GC? --    No data found.  Updated Vital Signs BP (!) 149/88 (BP Location: Left Arm)    Pulse 97    Temp 98.3 F (36.8 C) (Oral)    Resp 18    SpO2 98%   Visual Acuity Right Eye Distance:   Left Eye Distance:   Bilateral Distance:    Right Eye Near:   Left Eye Near:    Bilateral Near:     Physical Exam Vitals and nursing note reviewed.  Constitutional:      General: He is not in acute distress.    Appearance: Normal appearance. He is well-developed. He is not ill-appearing.  HENT:     Mouth/Throat:     Mouth: Mucous membranes are moist.  Cardiovascular:     Rate and Rhythm: Normal rate and regular rhythm.     Heart sounds: Normal heart sounds.  Pulmonary:     Effort: Pulmonary effort is normal. No respiratory distress.     Breath sounds: Normal breath sounds.  Musculoskeletal:        General:  Tenderness present. No swelling or deformity. Normal range of motion.     Cervical back: Neck supple.     Comments: Left trapezius tender to palpation.  Left UE: FROM, strength 5/5, sensation intact, 2+ pulses.    Skin:    General: Skin is warm and dry.     Capillary Refill: Capillary refill takes less than 2 seconds.     Findings: No bruising, erythema, lesion or rash.  Neurological:     General: No focal deficit present.     Mental Status: He is alert and oriented to person, place, and time.     Sensory: No sensory deficit.     Motor: No weakness.  Psychiatric:        Mood and Affect: Mood normal.        Behavior: Behavior normal.     UC Treatments / Results  Labs (all labs ordered are listed, but only abnormal results are displayed) Labs Reviewed - No data to display  EKG   Radiology No results found.  Procedures Procedures (including critical care time)  Medications Ordered in UC Medications - No data to display  Initial Impression / Assessment and Plan / UC Course  I have reviewed the triage vital signs and the nursing notes.  Pertinent labs & imaging results that were available during my care of the patient were reviewed by me and considered in my medical decision making (see chart for details).    Muscle spasm.  Patient declines x-ray today.  He states his symptoms are improving with the ibuprofen and cyclobenzaprine; instructed him to continue taking these as prescribed.  Work note provided per patient request.  Instructed him to follow-up with his PCP or an orthopedist if his symptoms are not improving.  He agrees to plan of care.   Final Clinical Impressions(s) / UC Diagnoses   Final diagnoses:  Muscle spasm     Discharge Instructions      Continue the ibuprofen and cyclobenzaprine as prescribed.  Follow up with your primary care provider or an orthopedist if your symptoms are not improving.        ED Prescriptions   None    I have reviewed the  PDMP during this encounter.   Mickie Bail, NP 10/04/21 1441

## 2021-10-04 NOTE — ED Triage Notes (Signed)
Pt c/o neck and shoulder pain x 3 weeks. Pt was seen at [facility] on 09/28/21 was prescribed ibuprofen and muscle relaxer. He felt a "pop" in his shoulder yesterday.

## 2021-10-20 ENCOUNTER — Other Ambulatory Visit: Payer: Self-pay

## 2021-10-20 ENCOUNTER — Ambulatory Visit
Admission: EM | Admit: 2021-10-20 | Discharge: 2021-10-20 | Disposition: A | Discharge status: Home / Self Care | Payer: 59 | Attending: Urgent Care | Admitting: Urgent Care

## 2021-10-20 ENCOUNTER — Ambulatory Visit (INDEPENDENT_AMBULATORY_CARE_PROVIDER_SITE_OTHER): Payer: 59

## 2021-10-20 DIAGNOSIS — M79672 Pain in left foot: Secondary | ICD-10-CM | POA: Diagnosis not present

## 2021-10-20 DIAGNOSIS — M722 Plantar fascial fibromatosis: Secondary | ICD-10-CM

## 2021-10-20 MED ORDER — NAPROXEN 375 MG PO TABS: 375.0000 mg | ORAL_TABLET | Freq: Two times a day (BID) | ORAL | 0 refills | Status: DC

## 2021-10-20 NOTE — ED Triage Notes (Signed)
Pt c/o pain to lt heel after running yesterday. States has had aching to that area in the past.

## 2021-10-20 NOTE — ED Provider Notes (Signed)
[facility]   MRN: [id number] DOB: Jun 04, 1991  Subjective:   Curtis Holland is a 31 y.o. male presenting for 2-day history of acute onset recurrent left heel pain.  Patient states that he went running yesterday and started bothering him again.  Denies any particular trauma, bruising, wounds.  He does do a lot of work where he stands on his feet for long periods of time.  Has never had to see a podiatrist for this.  No current facility-administered medications for this encounter.  Current Outpatient Medications:    cyclobenzaprine (FLEXERIL) 10 MG tablet, Take 1 tablet (10 mg total) by mouth 3 (three) times daily as needed for muscle spasms., Disp: 20 tablet, Rfl: 0   ibuprofen (ADVIL) 800 MG tablet, Take 1 tablet (800 mg total) by mouth every 8 (eight) hours as needed (pain)., Disp: 30 tablet, Rfl: 0   No Known Allergies  Past Medical History:  Diagnosis Date   History of ear infections      Past Surgical History:  Procedure Laterality Date   APPENDECTOMY  04/03/2019   LAPAROSCOPIC APPENDECTOMY N/A 04/03/2019   Procedure: APPENDECTOMY LAPAROSCOPIC;  Surgeon: Andria Meuse, MD;  Location: [facility] OR;  Service: General;  Laterality: N/A;   TYMPANOSTOMY TUBE PLACEMENT      History reviewed. No pertinent family history.  Social History   Tobacco Use   Smoking status: Never   Smokeless tobacco: Never  Vaping Use   Vaping Use: Never used  Substance Use Topics   Alcohol use: No   Drug use: Yes    Types: Marijuana    ROS   Objective:   Vitals: BP 129/84 (BP Location: Left Arm)    Pulse 82    Temp 98.3 F (36.8 C) (Oral)    Resp 18    SpO2 98%   Physical Exam Constitutional:      General: He is not in acute distress.    Appearance: Normal appearance. He is well-developed and normal weight. He is not ill-appearing, toxic-appearing or diaphoretic.  HENT:     Head: Normocephalic and atraumatic.     Right Ear: External ear normal.     Left Ear:  External ear normal.     Nose: Nose normal.     Mouth/Throat:     Pharynx: Oropharynx is clear.  Eyes:     General: No scleral icterus.       Right eye: No discharge.        Left eye: No discharge.     Extraocular Movements: Extraocular movements intact.  Cardiovascular:     Rate and Rhythm: Normal rate.  Pulmonary:     Effort: Pulmonary effort is normal.  Musculoskeletal:     Cervical back: Normal range of motion.     Left foot: Normal range of motion and normal capillary refill. Tenderness present. No swelling, deformity, laceration, bony tenderness or crepitus.       Feet:  Neurological:     Mental Status: He is alert and oriented to person, place, and time.  Psychiatric:        Mood and Affect: Mood normal.        Behavior: Behavior normal.        Thought Content: Thought content normal.        Judgment: Judgment normal.    DG Foot Complete Left  Result Date: 10/20/2021 CLINICAL DATA:  Left heel pain after running yesterday. EXAM: LEFT FOOT - COMPLETE 3+ VIEW COMPARISON:  11/18/2019 FINDINGS: Hallux valgus  deformity. No acute fracture or dislocation. No periosteal reaction or callus deposition. Tiny Achilles and calcaneal spurs. IMPRESSION: No acute osseous abnormality. Electronically Signed   By: Jeronimo Greaves M.D.   On: 10/20/2021 10:41     Assessment and Plan :   PDMP not reviewed this encounter.  1. Plantar fasciitis of left foot    Suspect plantar fasciitis of the left foot/heel.  Recommended naproxen, ice water baths, rehab.  Follow-up with podiatry. Counseled patient on potential for adverse effects with medications prescribed/recommended today, ER and return-to-clinic precautions discussed, patient verbalized understanding.    Wallis Bamberg, PA-C 10/20/21 1058

## 2021-10-28 ENCOUNTER — Other Ambulatory Visit: Payer: Self-pay

## 2021-10-28 ENCOUNTER — Ambulatory Visit: Payer: 59 | Admitting: Podiatry

## 2021-10-28 ENCOUNTER — Ambulatory Visit (INDEPENDENT_AMBULATORY_CARE_PROVIDER_SITE_OTHER): Payer: 59

## 2021-10-28 DIAGNOSIS — M722 Plantar fascial fibromatosis: Secondary | ICD-10-CM

## 2021-10-28 MED ORDER — TRIAMCINOLONE ACETONIDE 10 MG/ML IJ SUSP
10.0000 mg | Freq: Once | INTRAMUSCULAR | Status: AC
  Administered 2021-10-28: 10 mg

## 2021-10-28 MED ORDER — DICLOFENAC SODIUM 75 MG PO TBEC: 75.0000 mg | DELAYED_RELEASE_TABLET | Freq: Two times a day (BID) | ORAL | 2 refills | Status: AC

## 2021-10-28 NOTE — Patient Instructions (Signed)

## 2021-10-29 NOTE — Progress Notes (Signed)
Subjective:   Patient ID: Curtis Holland, male   DOB: 31 y.o.   MRN: [id number]   HPI Patient presents stating that the left heel has been very tender for around 6 months and gradually getting worse.  He is moderately obese she is trying to get more active and this is keeping him from doing things that he like to do with the pain worse in the morning after sitting.  Patient does not smoke likes to be active   Review of Systems  All other systems reviewed and are negative.      Objective:  Physical Exam Vitals and nursing note reviewed.  Constitutional:      Appearance: He is well-developed.  Pulmonary:     Effort: Pulmonary effort is normal.  Musculoskeletal:        General: Normal range of motion.  Skin:    General: Skin is warm.  Neurological:     Mental Status: He is alert.    Neurovascular status intact muscle strength was found to be adequate range of motion adequate with exquisite discomfort plantar aspect left heel at the insertional point of the tendon into the calcaneus with inflammation fluid around the tendon.  Patient is found to have good digital perfusion well oriented x3 and moderate depression of the arch is noted with obesity noted     Assessment:  Acute plantar fasciitis left at the insertion tendon calcaneus with fluid buildup and flatfoot deformity     Plan:  H&P reviewed condition sterile prep and injected the fascia 3 mg Kenalog 5 mg Xylocaine and applied fascial brace placed on diclofenac 75 mg twice daily gave instructions for support and reappoint to recheck in 2 weeks and may require orthotics  X-rays indicate there is moderate depression of the arch no spur no stress fracture noted or arthritis

## 2021-11-03 ENCOUNTER — Other Ambulatory Visit: Payer: Self-pay

## 2021-11-03 ENCOUNTER — Ambulatory Visit
Admission: RE | Admit: 2021-11-03 | Discharge: 2021-11-03 | Disposition: A | Discharge status: Home / Self Care | Payer: 59 | Source: Ambulatory Visit | Attending: Urgent Care | Admitting: Urgent Care

## 2021-11-03 VITALS — BP 134/86 | HR 72 | Temp 98.5°F | Resp 18

## 2021-11-03 DIAGNOSIS — H9192 Unspecified hearing loss, left ear: Secondary | ICD-10-CM | POA: Diagnosis not present

## 2021-11-03 DIAGNOSIS — H6122 Impacted cerumen, left ear: Secondary | ICD-10-CM | POA: Diagnosis not present

## 2021-11-03 MED ORDER — CETIRIZINE HCL 10 MG PO TABS
10.0000 mg | ORAL_TABLET | Freq: Every day | ORAL | 0 refills | Status: AC
Start: 2021-11-03 — End: ?

## 2021-11-03 MED ORDER — PSEUDOEPHEDRINE HCL 60 MG PO TABS: 60.0000 mg | ORAL_TABLET | Freq: Three times a day (TID) | ORAL | 0 refills | Status: AC | PRN

## 2021-11-03 MED ORDER — CARBAMIDE PEROXIDE 6.5 % OT SOLN: 5.0000 [drp] | Freq: Two times a day (BID) | OTIC | 0 refills | Status: AC

## 2021-11-03 NOTE — ED Triage Notes (Signed)
Pt presents with left ear pain after using ear plugs to swim

## 2021-11-03 NOTE — ED Provider Notes (Signed)
Curtis Holland   MRN: [id number] DOB: 05-11-1991  Subjective:   Curtis Holland is a 31 y.o. male presenting for decreased hearing after using ear plugs for swimming yesterday. States that initially he pulled out the silicon putty from his left ear and felt a lot of pain. Saw reddish tinge to the ear plug and then felt a pop with a lot more drainage. Has had decreased hearing since. No fever, pain, dizziness, tinnitus. Has a remote history of ear infections, had tympanostomy bilaterally when he was a child.   No current facility-administered medications for this encounter.  Current Outpatient Medications:    diclofenac (VOLTAREN) 75 MG EC tablet, Take 1 tablet (75 mg total) by mouth 2 (two) times daily., Disp: 50 tablet, Rfl: 2   cyclobenzaprine (FLEXERIL) 10 MG tablet, Take 1 tablet (10 mg total) by mouth 3 (three) times daily as needed for muscle spasms., Disp: 20 tablet, Rfl: 0   ibuprofen (ADVIL) 800 MG tablet, Take 1 tablet (800 mg total) by mouth every 8 (eight) hours as needed (pain)., Disp: 30 tablet, Rfl: 0   naproxen (NAPROSYN) 375 MG tablet, Take 1 tablet (375 mg total) by mouth 2 (two) times daily with a meal., Disp: 30 tablet, Rfl: 0   No Known Allergies  Past Medical History:  Diagnosis Date   History of ear infections      Past Surgical History:  Procedure Laterality Date   APPENDECTOMY  04/03/2019   LAPAROSCOPIC APPENDECTOMY N/A 04/03/2019   Procedure: APPENDECTOMY LAPAROSCOPIC;  Surgeon: Andria Meuse, MD;  Location: [facility] OR;  Service: General;  Laterality: N/A;   TYMPANOSTOMY TUBE PLACEMENT      History reviewed. No pertinent family history.  Social History   Tobacco Use   Smoking status: Never   Smokeless tobacco: Never  Vaping Use   Vaping Use: Never used  Substance Use Topics   Alcohol use: No   Drug use: Yes    Types: Marijuana    ROS   Objective:   Vitals: BP 134/86 (BP Location: Left Arm)    Pulse 72    Temp 98.5 F (36.9  C) (Oral)    Resp 18    SpO2 97%   Physical Exam Constitutional:      General: He is not in acute distress.    Appearance: Normal appearance. He is well-developed and normal weight. He is not ill-appearing, toxic-appearing or diaphoretic.  HENT:     Head: Normocephalic and atraumatic.     Right Ear: Tympanic membrane, ear canal and external ear normal. There is no impacted cerumen.     Left Ear: Tympanic membrane, ear canal and external ear normal. There is no impacted cerumen.     Ears:     Comments: Ceruminous ear canal of the left side without impaction.    Nose: Nose normal. No congestion or rhinorrhea.     Mouth/Throat:     Mouth: Mucous membranes are moist.     Pharynx: Oropharynx is clear. No oropharyngeal exudate or posterior oropharyngeal erythema.  Eyes:     General: No scleral icterus.       Right eye: No discharge.        Left eye: No discharge.     Extraocular Movements: Extraocular movements intact.     Conjunctiva/sclera: Conjunctivae normal.  Cardiovascular:     Rate and Rhythm: Normal rate.  Pulmonary:     Effort: Pulmonary effort is normal.  Musculoskeletal:     Cervical back: Normal range  of motion and neck supple. No rigidity. No muscular tenderness.  Neurological:     General: No focal deficit present.     Mental Status: He is alert and oriented to person, place, and time.  Psychiatric:        Mood and Affect: Mood normal.        Behavior: Behavior normal.        Thought Content: Thought content normal.        Judgment: Judgment normal.    Assessment and Plan :   PDMP not reviewed this encounter.  1. Excessive cerumen in ear canal, left   2. Decreased hearing of left ear     Unremarkable ENT exam apart from the ceruminous ear canal.  Suspect patient actually pulled out impacted cerumen.  Therefore I recommended Debrox.  Otherwise, will use conservative management for what I suspect is eustachian tube dysfunction.  Recommended starting Flonase,  Zyrtec, Sudafed.  Follow-up with ENT if symptoms persist.  Counseled patient on potential for adverse effects with medications prescribed/recommended today, ER and return-to-clinic precautions discussed, patient verbalized understanding.    Wallis Bamberg, [place] 11/03/21 1635

## 2022-01-05 ENCOUNTER — Encounter (HOSPITAL_COMMUNITY): Payer: Self-pay | Admitting: Emergency Medicine

## 2022-01-05 ENCOUNTER — Ambulatory Visit (HOSPITAL_COMMUNITY)
Admission: EM | Admit: 2022-01-05 | Discharge: 2022-01-05 | Disposition: A | Discharge status: Home / Self Care | Payer: 59 | Attending: Family Medicine | Admitting: Family Medicine

## 2022-01-05 DIAGNOSIS — M62830 Muscle spasm of back: Secondary | ICD-10-CM | POA: Diagnosis not present

## 2022-01-05 DIAGNOSIS — M549 Dorsalgia, unspecified: Secondary | ICD-10-CM | POA: Diagnosis not present

## 2022-01-05 MED ORDER — CYCLOBENZAPRINE HCL 10 MG PO TABS: ORAL_TABLET | ORAL | 0 refills | Status: AC

## 2022-01-05 NOTE — ED Triage Notes (Signed)
Pt c/o dull ache in back on Thursday. Went to [employer] over the weekend and believes exacerbated pain bc pain now worse. Pain is worse with movement/twisting or bending.  ?

## 2022-01-05 NOTE — ED Provider Notes (Signed)
?[facility] ? ? ?[phone] ?01/05/22 Arrival Time: 1516 ? ?ASSESSMENT & PLAN: ? ?1. Muscle spasm of back   ?2. Mid back pain on right side   ? ?Able to ambulate here and hemodynamically stable. ?No indication for imaging of back at this time given no trauma and normal neurological exam. Discussed. ? ?Continue Diclofenac BID. ?Begin: ?Meds ordered this encounter  ?Medications  ? cyclobenzaprine (FLEXERIL) 10 MG tablet  ?  Sig: Take 1 tablet by mouth 3 times daily as needed for muscle spasm. Warning: May cause drowsiness.  ?  Dispense:  21 tablet  ?  Refill:  0  ? ?Work note provided. ?Medication sedation precautions given. ?Encourage ROM/movement as tolerated. ? ?Recommend: ? Follow-up Information   ? ? [facility].   ?Why: If worsening or failing to improve as anticipated. ?Contact information: ?[address] ?[place] [address] ?[phone] ? ?  ?  ? ?  ?  ? ?  ? ? ?Reviewed expectations re: course of current medical issues. Questions answered. ?Outlined signs and symptoms indicating need for more acute intervention. ?Patient verbalized understanding. ?After Visit Summary given. ? ? ?SUBJECTIVE: ?History from: patient. ? ?Curtis Holland is a 31 y.o. male who presents with complaint of fairly persistent R sidedmid back discomfort. Onset gradual. First noted  over past few day . Injury/trama: no. Noted [place] amusement [place]. History of back problems requiring medical care: rare. Pain described as aching and without radiation. ?Aggravating factors: certain movements. Alleviating factors: have not been identified. Progressive LE weakness or saddle anesthesia: none. Extremity sensation changes or weakness: none. Ambulatory without difficulty. Normal bowel/bladder habits: yes; without urinary retention. Normal PO intake without n/v. No associated abdominal pain/n/v. Self treatment: has  Diclofenac with some help . ? ?Reports no chronic steroid use,  fevers, IV drug use, or recent back surgeries or procedures. ? ?OBJECTIVE: ? ?Vitals:  ? 01/05/22 1629  ?BP: (!) 148/79  ?Pulse: 70  ?Resp: 18  ?Temp: 98.8 ?F (37.1 ?C)  ?TempSrc: Oral  ?SpO2: 97%  ?  ?General appearance: alert; no distress ?HEENT: [place]; AT ?Neck: supple with FROM; without midline tenderness ?CV: regular ?Lungs: unlabored respirations; speaks full sentences without difficulty ?Abdomen: soft, non-tender; non-distended ?Back: mild  and poorly localized tenderness to palpation over R mid paraspinal musculature ; FROM at waist; bruising: none; without midline tenderness ?Extremities: without edema; symmetrical without gross deformities; normal ROM of bilateral LE ?Skin: warm and dry ?Neurologic: normal gait; normal sensation and strength of bilateral LE ?Psychological: alert and cooperative; normal mood and affect ? ? ?No Known Allergies ? ?Past Medical History:  ?Diagnosis Date  ? History of ear infections   ? ?Social History  ? ?Socioeconomic History  ? Marital status: Single  ?  Spouse name: Not on file  ? Number of children: Not on file  ? Years of education: Not on file  ? Highest education level: Not on file  ?Occupational History  ? Not on file  ?Tobacco Use  ? Smoking status: Never  ? Smokeless tobacco: Never  ?Vaping Use  ? Vaping Use: Never used  ?Substance and Sexual Activity  ? Alcohol use: No  ? Drug use: Yes  ?  Types: Marijuana  ? Sexual activity: Not on file  ?Other Topics Concern  ? Not on file  ?Social History Narrative  ? Not on file  ? ?Social Determinants of Health  ? ?Financial Resource Strain: Not on file  ?  Food Insecurity: Not on file  ?Transportation Needs: Not on file  ?Physical Activity: Not on file  ?Stress: Not on file  ?Social Connections: Not on file  ?Intimate Partner Violence: Not on file  ? ?No family history on file. ?Past Surgical History:  ?Procedure Laterality Date  ? APPENDECTOMY  04/03/2019  ? LAPAROSCOPIC APPENDECTOMY N/A 04/03/2019  ? Procedure: APPENDECTOMY  LAPAROSCOPIC;  Surgeon: Andria Meuse, MD;  Location: [facility] OR;  Service: General;  Laterality: N/A;  ? TYMPANOSTOMY TUBE PLACEMENT    ? ? ?  ?Mardella Layman, MD ?01/05/22 [place] ? ?

## 2022-03-18 ENCOUNTER — Emergency Department (HOSPITAL_COMMUNITY)
Admission: EM | Admit: 2022-03-18 | Discharge: 2022-03-18 | Disposition: A | Discharge status: Home / Self Care | Payer: 59 | Attending: Emergency Medicine | Admitting: Emergency Medicine

## 2022-03-18 ENCOUNTER — Encounter (HOSPITAL_COMMUNITY): Payer: Self-pay

## 2022-03-18 DIAGNOSIS — N4281 Prostatodynia syndrome: Secondary | ICD-10-CM | POA: Diagnosis present

## 2022-03-18 NOTE — Discharge Instructions (Signed)
Call the urologist to schedule a follow-up visit

## 2022-03-18 NOTE — ED Triage Notes (Signed)
Pt states that for approximately six months he has had the feeling of needing to urinate and then not feeling completely empty after urinating. States that he Googled his symptoms and is worried he has prostate issues. Also states that prior to coming in he checked his own prostate and "it felt hard to the touch".

## 2022-03-18 NOTE — ED Provider Notes (Signed)
[facility]-EMERGENCY DEPT Provider Note   CSN: [id number] Arrival date & time: 03/18/22  0900     History  No chief complaint on file.   Curtis Holland is a 31 y.o. male.  31 year old male presents with 31-month history of trouble urinating.  States he feels that his bladder does not completely empty.  Denies any dysuria or hematuria.  No fever or flank pain.  Did a self check of his prostate at home and it felt firm to him.  Denies any family history of prostate cancer.  No penile drainage or discharge.  States that he is currently not sexually active with a partner.       Home Medications Prior to Admission medications   Medication Sig Start Date End Date Taking? Authorizing Provider  carbamide peroxide (DEBROX) 6.5 % OTIC solution Place 5 drops into the left ear 2 (two) times daily. 11/03/21   Wallis Bamberg, PA-C  cetirizine (ZYRTEC ALLERGY) 10 MG tablet Take 1 tablet (10 mg total) by mouth daily. 11/03/21   Wallis Bamberg, PA-C  cyclobenzaprine (FLEXERIL) 10 MG tablet Take 1 tablet by mouth 3 times daily as needed for muscle spasm. Warning: May cause drowsiness. 01/05/22   Mardella Layman, MD  diclofenac (VOLTAREN) 75 MG EC tablet Take 1 tablet (75 mg total) by mouth 2 (two) times daily. 10/28/21   Lenn Sink, DPM  pseudoephedrine (SUDAFED) 60 MG tablet Take 1 tablet (60 mg total) by mouth every 8 (eight) hours as needed for congestion. 11/03/21   Wallis Bamberg, PA-C      Allergies    Patient has no known allergies.    Review of Systems   Review of Systems  All other systems reviewed and are negative.   Physical Exam Updated Vital Signs BP (!) 167/99 (BP Location: Left Arm)   Pulse 81   Temp 98.1 F (36.7 C) (Oral)   Resp 18   SpO2 99%  Physical Exam Vitals and nursing note reviewed.  Constitutional:      General: He is not in acute distress.    Appearance: Normal appearance. He is well-developed. He is not toxic-appearing.  HENT:     Head:  Normocephalic and atraumatic.  Eyes:     General: Lids are normal.     Conjunctiva/sclera: Conjunctivae normal.     Pupils: Pupils are equal, round, and reactive to light.  Neck:     Thyroid: No thyroid mass.     Trachea: No tracheal deviation.  Cardiovascular:     Rate and Rhythm: Normal rate and regular rhythm.     Heart sounds: Normal heart sounds. No murmur heard.    No gallop.  Pulmonary:     Effort: Pulmonary effort is normal. No respiratory distress.     Breath sounds: Normal breath sounds. No stridor. No decreased breath sounds, wheezing, rhonchi or rales.  Abdominal:     General: There is no distension.     Palpations: Abdomen is soft.     Tenderness: There is no abdominal tenderness. There is no rebound.  Musculoskeletal:        General: No tenderness. Normal range of motion.     Cervical back: Normal range of motion and neck supple.  Skin:    General: Skin is warm and dry.     Findings: No abrasion or rash.  Neurological:     Mental Status: He is alert and oriented to person, place, and time. Mental status is at baseline.  GCS: GCS eye subscore is 4. GCS verbal subscore is 5. GCS motor subscore is 6.     Cranial Nerves: No cranial nerve deficit.     Sensory: No sensory deficit.     Motor: Motor function is intact.  Psychiatric:        Attention and Perception: Attention normal.        Speech: Speech normal.        Behavior: Behavior normal.     ED Results / Procedures / Treatments   Labs (all labs ordered are listed, but only abnormal results are displayed) Labs Reviewed - No data to display  EKG None  Radiology No results found.  Procedures Procedures    Medications Ordered in ED Medications - No data to display  ED Course/ Medical Decision Making/ A&P                           Medical Decision Making  Patient here complaining of prostate issues x6 months.  No concern for urinary tract infection.  Will refer to urology  on-call        Final Clinical Impression(s) / ED Diagnoses Final diagnoses:  None    Rx / DC Orders ED Discharge Orders     None         Lorre Nick, MD 03/18/22 [phone]
